# Patient Record
Sex: Female | Born: 1995 | Race: White | Hispanic: No | Marital: Married | Smoking: Never smoker
Health system: Southern US, Community
[De-identification: ages and names within clinical notes are randomized; demographics above are authoritative.]

## PROBLEM LIST (undated history)

## (undated) ENCOUNTER — Inpatient Hospital Stay: Payer: Self-pay

## (undated) DIAGNOSIS — R519 Headache, unspecified: Secondary | ICD-10-CM

## (undated) DIAGNOSIS — I10 Essential (primary) hypertension: Secondary | ICD-10-CM

## (undated) DIAGNOSIS — Z789 Other specified health status: Secondary | ICD-10-CM

---
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
## 2016-02-08 ENCOUNTER — Emergency Department: Payer: Medicaid Other

## 2016-02-08 ENCOUNTER — Emergency Department
Admission: EM | Admit: 2016-02-08 | Discharge: 2016-02-08 | Disposition: A | Discharge status: Home / Self Care | Payer: Medicaid Other | Attending: Emergency Medicine | Admitting: Emergency Medicine

## 2016-02-08 DIAGNOSIS — Z91013 Allergy to seafood: Secondary | ICD-10-CM | POA: Diagnosis not present

## 2016-02-08 DIAGNOSIS — F322 Major depressive disorder, single episode, severe without psychotic features: Secondary | ICD-10-CM | POA: Diagnosis not present

## 2016-02-08 DIAGNOSIS — R1031 Right lower quadrant pain: Secondary | ICD-10-CM | POA: Diagnosis present

## 2016-02-08 DIAGNOSIS — N83201 Unspecified ovarian cyst, right side: Secondary | ICD-10-CM | POA: Diagnosis not present

## 2016-02-08 DIAGNOSIS — Z91018 Allergy to other foods: Secondary | ICD-10-CM | POA: Insufficient documentation

## 2016-02-08 DIAGNOSIS — R102 Pelvic and perineal pain: Secondary | ICD-10-CM

## 2016-02-08 DIAGNOSIS — N83209 Unspecified ovarian cyst, unspecified side: Secondary | ICD-10-CM

## 2016-02-08 DIAGNOSIS — R1032 Left lower quadrant pain: Secondary | ICD-10-CM | POA: Insufficient documentation

## 2016-02-08 DIAGNOSIS — Z79899 Other long term (current) drug therapy: Secondary | ICD-10-CM | POA: Diagnosis not present

## 2016-02-08 LAB — URINALYSIS COMPLETE WITH MICROSCOPIC (ARMC ONLY)
BACTERIA UA: NONE SEEN
Bilirubin Urine: NEGATIVE
Glucose, UA: NEGATIVE mg/dL
Hgb urine dipstick: NEGATIVE
Ketones, ur: NEGATIVE mg/dL
Leukocytes, UA: NEGATIVE
Nitrite: NEGATIVE
PROTEIN: NEGATIVE mg/dL
Specific Gravity, Urine: 1.028 (ref 1.005–1.030)
pH: 5 (ref 5.0–8.0)

## 2016-02-08 LAB — COMPREHENSIVE METABOLIC PANEL
ALBUMIN: 4.2 g/dL (ref 3.5–5.0)
ALK PHOS: 61 U/L (ref 38–126)
ALT: 21 U/L (ref 14–54)
ANION GAP: 5 (ref 5–15)
AST: 23 U/L (ref 15–41)
BUN: 14 mg/dL (ref 6–20)
CO2: 22 mmol/L (ref 22–32)
Calcium: 9.2 mg/dL (ref 8.9–10.3)
Chloride: 108 mmol/L (ref 101–111)
Creatinine, Ser: 0.79 mg/dL (ref 0.44–1.00)
GFR calc Af Amer: >60 mL/min (ref >60)
GFR calc non Af Amer: >60 mL/min (ref >60)
GLUCOSE: 133 mg/dL — AB (ref 65–99)
POTASSIUM: 4.1 mmol/L (ref 3.5–5.1)
SODIUM: 135 mmol/L (ref 135–145)
Total Bilirubin: 0.3 mg/dL (ref 0.3–1.2)
Total Protein: 7.7 g/dL (ref 6.5–8.1)

## 2016-02-08 LAB — CBC
HEMATOCRIT: 43 % (ref 35.0–47.0)
HEMOGLOBIN: 14.5 g/dL (ref 12.0–16.0)
MCH: 28.8 pg (ref 26.0–34.0)
MCHC: 33.8 g/dL (ref 32.0–36.0)
MCV: 85.2 fL (ref 80.0–100.0)
Platelets: 278 10*3/uL (ref 150–440)
RBC: 5.05 MIL/uL (ref 3.80–5.20)
RDW: 14.1 % (ref 11.5–14.5)
WBC: 9 10*3/uL (ref 3.6–11.0)

## 2016-02-08 LAB — POCT PREGNANCY, URINE: PREG TEST UR: NEGATIVE

## 2016-02-08 LAB — LIPASE, BLOOD: Lipase: 20 U/L (ref 11–51)

## 2016-02-08 NOTE — Discharge Instructions (Signed)

## 2016-02-08 NOTE — ED Notes (Signed)
Pt c/o central abd pain that radiates out to the lower abd and back since around 845am .. Denies N/V/D.Shannon Madden. Last BM yesterday

## 2016-02-08 NOTE — ED Provider Notes (Signed)
[facility] Emergency Department Provider Note  ____________________________________________  Time seen: 2:30 PM  I have reviewed the triage vital signs and the nursing notes.   HISTORY  Chief Complaint Abdominal Pain    HPI Shannon Madden is a 20 y.o. female who complains of bilateral lower abdominal pain radiating around to the right back that started suddenly at about 9 AM. It is very severe at the time lasted for an hour or 2 and then seems to have abated. It is still present but very mild at this time. It's been constant. Not colicky. No previous similar pain. No history of ovarian cyst. Not pregnant. Normal regular menstrual periods. No dysuria frequency urgency or hematuria. She is sexually active. No vaginal discharge.  No trauma, no nausea vomiting diarrhea or fevers. Eating and drinking okay.     Past Medical History  Diagnosis Date  . Medical history non-contributory    depression   Patient Active Problem List   Diagnosis Date Noted  . MDD (major depressive disorder), single episode, severe (HCC) 12/24/2013     History reviewed. No pertinent past surgical history.   Current Outpatient Rx  Name  Route  Sig  Dispense  Refill  . PARoxetine (PAXIL) 20 MG tablet   Oral   Take 1 tablet (20 mg total) by mouth at bedtime.   30 tablet   0      Allergies Coconut oil and Fish allergy   No family history on file.  Social History Social History  Substance Use Topics  . Smoking status: Never Smoker   . Smokeless tobacco: Never Used  . Alcohol Use: No    Review of Systems  Constitutional:   No fever or chills. No weight changes Eyes:   No vision changes.  ENT:   No sore throat. No rhinorrhea. Cardiovascular:   No chest pain. Respiratory:   No dyspnea or cough. Gastrointestinal:   Positive for abdominal pain as above, no diarrhea or vomiting.Marland Kitchen.  No BRBPR or melena. Genitourinary:   Negative for dysuria or difficulty  urinating. Musculoskeletal:   Negative for focal pain or swelling Skin:   Negative for rash. Neurological:   Negative for headaches, focal weakness or numbness.  10-point ROS otherwise negative.  ____________________________________________   PHYSICAL EXAM:  VITAL SIGNS: ED Triage Vitals  Enc Vitals Group     BP 02/08/16 1009 130/69 mmHg     Pulse Rate 02/08/16 1009 96     Resp 02/08/16 1009 17     Temp 02/08/16 1009 98.9 F (37.2 C)     Temp Source 02/08/16 1009 Oral     SpO2 02/08/16 1009 99 %     Weight 02/08/16 1009 175 lb (79.379 kg)     Height 02/08/16 1009 5\' 6"  (1.676 m)     Head Cir --      Peak Flow --      Pain Score 02/08/16 1010 5     Pain Loc --      Pain Edu? --      Excl. in GC? --     Vital signs reviewed, nursing assessments reviewed.   Constitutional:   Alert and oriented. Well appearing and in no distress. Eyes:   No scleral icterus. No conjunctival pallor. PERRL. EOMI ENT   Head:   Normocephalic and atraumatic.   Nose:   No congestion/rhinnorhea. No septal hematoma   Mouth/Throat:   MMM, no pharyngeal erythema. No peritonsillar mass.    Neck:  No stridor. No SubQ emphysema. No meningismus. Hematological/Lymphatic/Immunilogical:   No cervical lymphadenopathy. Cardiovascular:   RRR. Symmetric bilateral radial and DP pulses.  No murmurs.  Respiratory:   Normal respiratory effort without tachypnea nor retractions. Breath sounds are clear and equal bilaterally. No wheezes/rales/rhonchi. Gastrointestinal:   Soft with diffuse mild tenderness of the bilateral lower abdomen and suprapubic area. No focal tenderness in the right lower quadrant or at McBurney's point.. Non distended. There is no CVA tenderness.  No rebound, rigidity, or guarding. Genitourinary:   deferred Musculoskeletal:   Nontender with normal range of motion in all extremities. No joint effusions.  No lower extremity tenderness.  No edema. Neurologic:   Normal speech and  language.  CN 2-10 normal. Motor grossly intact. No gross focal neurologic deficits are appreciated.  Skin:    Skin is warm, dry and intact. No rash noted.  No petechiae, purpura, or bullae. ____________________________________________    LABS (pertinent positives/negatives) (all labs ordered are listed, but only abnormal results are displayed) Labs Reviewed  COMPREHENSIVE METABOLIC PANEL - Abnormal; Notable for the following:    Glucose, Bld 133 (*)    All other components within normal limits  URINALYSIS COMPLETEWITH MICROSCOPIC ([facility] ONLY) - Abnormal; Notable for the following:    Color, Urine YELLOW (*)    APPearance CLEAR (*)    Squamous Epithelial / LPF 0-5 (*)    All other components within normal limits  LIPASE, BLOOD  CBC  POC URINE PREG, ED  POCT PREGNANCY, URINE   ____________________________________________   EKG    ____________________________________________    RADIOLOGY  Ultrasound pelvis shows a small amount of physiologic free fluid, likely recently ruptured ovarian cyst. No evidence of current cyst or torsion.  ____________________________________________   PROCEDURES   ____________________________________________   INITIAL IMPRESSION / ASSESSMENT AND PLAN / ED COURSE  Pertinent labs & imaging results that were available during my care of the patient were reviewed by me and considered in my medical decision making (see chart for details).  Visual well-appearing no acute distress. Had an episode of severe pelvic pain, suspected ovarian cyst. She does have some free fluid which again suggests that maybe there was a recently ruptured cyst. There is no evidence of STI or PID TOA torsion currently. Low suspicion for appendicitis. We will discharge home, follow-up with pediatrician. Patient counseled extensively with return precautions     ____________________________________________   FINAL CLINICAL IMPRESSION(S) / ED DIAGNOSES  Final  diagnoses:  Ruptured ovarian cyst   pelvic pain in female.    Sharman Cheek, MD 02/08/16 1630

## 2016-10-31 LAB — OB RESULTS CONSOLE RPR: RPR: NONREACTIVE

## 2016-10-31 LAB — OB RESULTS CONSOLE VARICELLA ZOSTER ANTIBODY, IGG: Varicella: IMMUNE

## 2016-10-31 LAB — OB RESULTS CONSOLE HIV ANTIBODY (ROUTINE TESTING): HIV: NONREACTIVE

## 2016-10-31 LAB — OB RESULTS CONSOLE RUBELLA ANTIBODY, IGM: Rubella: IMMUNE

## 2016-10-31 LAB — OB RESULTS CONSOLE HEPATITIS B SURFACE ANTIGEN: Hepatitis B Surface Ag: NEGATIVE

## 2016-11-18 NOTE — L&D Delivery Note (Signed)
Date of delivery: 05/06/17 Estimated Date of Delivery: 06/02/17 Patient's last menstrual period was 08/03/2016. EGA: 6149w1d  Delivery Note At 1:30 PM a viable female was delivered via Vaginal, Spontaneous Delivery (Presentation: cephalic, LOA).  APGAR: 7, 8; weight 6 lb 13.4 oz (3100 g).   Placenta status: spontaneous, intact.  Cord: 3v  with the following complications: nuchalx, 1loose.  Cord pH: not collected  Anesthesia:  none Episiotomy:  none Lacerations: 1st degree Suture Repair: 3.0 vicryl Est. Blood Loss (mL):  350cc  Mom presented to L&D with PPROM at 36.1 weeks and in labor. Was given IV stadol and fentanyl.  Progressed to complete, second stage: 20 mins.  delivery of fetal head with restitution to LOT. Nuchal cord not able to be reduced at perineum.  Anterior then posterior shoulders delivered without difficulty.   Due to flaccid tone, cord was immediately cut and handed off to pediatrician. Placenta spontaneously delivered, intact.   IM then IV pitocin given for hemorrhage prophylaxis, as IV access was lost during delivery.  The baby was attended to and was then placed on mom's chest.  We sang Happy [place] Ouch[place]irthday to baby Shannon Madden.   Mom to postpartum.  Baby to Couplet care / Skin to Skin.  Shannon Madden Shannon Madden Shannon Madden 05/06/2017, 6:46 PM

## 2016-12-01 ENCOUNTER — Emergency Department: Payer: Medicaid Other

## 2016-12-01 ENCOUNTER — Emergency Department
Admission: EM | Admit: 2016-12-01 | Discharge: 2016-12-01 | Disposition: A | Discharge status: Home / Self Care | Payer: Medicaid Other | Attending: Emergency Medicine | Admitting: Emergency Medicine

## 2016-12-01 DIAGNOSIS — Z3A13 13 weeks gestation of pregnancy: Secondary | ICD-10-CM | POA: Insufficient documentation

## 2016-12-01 DIAGNOSIS — Z3492 Encounter for supervision of normal pregnancy, unspecified, second trimester: Secondary | ICD-10-CM

## 2016-12-01 DIAGNOSIS — O4691 Antepartum hemorrhage, unspecified, first trimester: Secondary | ICD-10-CM | POA: Insufficient documentation

## 2016-12-01 DIAGNOSIS — R102 Pelvic and perineal pain: Secondary | ICD-10-CM | POA: Insufficient documentation

## 2016-12-01 DIAGNOSIS — O26891 Other specified pregnancy related conditions, first trimester: Secondary | ICD-10-CM | POA: Diagnosis present

## 2016-12-01 DIAGNOSIS — R8299 Other abnormal findings in urine: Secondary | ICD-10-CM | POA: Diagnosis not present

## 2016-12-01 DIAGNOSIS — N939 Abnormal uterine and vaginal bleeding, unspecified: Secondary | ICD-10-CM

## 2016-12-01 LAB — URINALYSIS, COMPLETE (UACMP) WITH MICROSCOPIC
BACTERIA UA: NONE SEEN
Bilirubin Urine: NEGATIVE
Glucose, UA: NEGATIVE mg/dL
Ketones, ur: 20 mg/dL — AB
LEUKOCYTES UA: NEGATIVE
Nitrite: NEGATIVE
PROTEIN: NEGATIVE mg/dL
SPECIFIC GRAVITY, URINE: 1.021 (ref 1.005–1.030)
pH: 5 (ref 5.0–8.0)

## 2016-12-01 LAB — ABO/RH: ABO/RH(D): O POS

## 2016-12-01 LAB — BASIC METABOLIC PANEL
ANION GAP: 8 (ref 5–15)
BUN: 8 mg/dL (ref 6–20)
CALCIUM: 9.2 mg/dL (ref 8.9–10.3)
CO2: 23 mmol/L (ref 22–32)
CREATININE: 0.55 mg/dL (ref 0.44–1.00)
Chloride: 103 mmol/L (ref 101–111)
GLUCOSE: 81 mg/dL (ref 65–99)
Potassium: 3.9 mmol/L (ref 3.5–5.1)
Sodium: 134 mmol/L — ABNORMAL LOW (ref 135–145)

## 2016-12-01 LAB — CBC WITH DIFFERENTIAL/PLATELET
BASOS ABS: 0 10*3/uL (ref 0–0.1)
Basophils Relative: 0 %
EOS ABS: 0.1 10*3/uL (ref 0–0.7)
Eosinophils Relative: 1 %
HCT: 35.7 % (ref 35.0–47.0)
Hemoglobin: 12.2 g/dL (ref 12.0–16.0)
Lymphocytes Relative: 16 %
Lymphs Abs: 1.8 10*3/uL (ref 1.0–3.6)
MCH: 29.3 pg (ref 26.0–34.0)
MCHC: 34.2 g/dL (ref 32.0–36.0)
MCV: 85.7 fL (ref 80.0–100.0)
MONO ABS: 0.7 10*3/uL (ref 0.2–0.9)
Monocytes Relative: 6 %
Neutro Abs: 8.7 10*3/uL — ABNORMAL HIGH (ref 1.4–6.5)
Neutrophils Relative %: 77 %
PLATELETS: 248 10*3/uL (ref 150–440)
RBC: 4.17 MIL/uL (ref 3.80–5.20)
RDW: 13.6 % (ref 11.5–14.5)
WBC: 11.2 10*3/uL — AB (ref 3.6–11.0)

## 2016-12-01 LAB — HCG, QUANTITATIVE, PREGNANCY: HCG, BETA CHAIN, QUANT, S: 104067 m[IU]/mL — AB (ref <5)

## 2016-12-01 NOTE — ED Notes (Signed)
Patient upset with waiting and continues to bleed. Informed her that there was a wait. I spoke with Dr. Scotty CourtStafford about patient and was given additional orders for labs and ultrasound.

## 2016-12-01 NOTE — Discharge Instructions (Signed)
Your blood tests and ultrasound today were unremarkable.  The pregnancy appears to be uncomplicated at this time.

## 2016-12-01 NOTE — ED Triage Notes (Signed)
Pt reports that after sexual intercourse this AM she is having bright red bleeding and mild pelvic cramping. Pt alert and oriented X4, active, cooperative, pt in NAD. RR even and unlabored, color WNL.

## 2016-12-01 NOTE — ED Notes (Signed)
FIRST NURSE NOTE: Pt states she is about 13-[redacted] weeks pregnant, vaginal bleeding started about 1 hour ago.  Some clots passed.

## 2016-12-01 NOTE — ED Provider Notes (Signed)
[facility] Emergency Department Provider Note  ____________________________________________  Time seen: Approximately 1:32 PM  I have reviewed the triage vital signs and the nursing notes.   HISTORY  Chief Complaint Vaginal Bleeding    HPI Shannon Madden is a 21 y.o. female who complains of vaginal bleeding today. The bleeding started after she had sex with her partner this morning. He was initially heavy and has gradually decreased over time. She is concerned because she is [redacted] weeks pregnant. [facility]. She had an ultrasound 11/08/2016 which confirmed an IUP. Pregnancy has been uncomplicated. This is her first pregnancy. Denies cramping or contractions. No fever chills vomiting chest pain or shortness of breath. Otherwise asymptomatic. No dysuria frequency urgency. No hematuria.     Past Medical History:  Diagnosis Date  . Medical history non-contributory   Second trimester pregnancy   Patient Active Problem List   Diagnosis Date Noted  . MDD (major depressive disorder), single episode, severe (HCC) 12/24/2013     History reviewed. No pertinent surgical history.   Prior to Admission medications   Medication Sig Start Date End Date Taking? Authorizing Provider  Butalbital-APAP-Caffeine 50-325-40 MG capsule Take 1 capsule by mouth every 4 (four) hours as needed. 11/27/16 12/07/16 Yes Historical Provider, MD     Allergies Coconut oil and Fish allergy   No family history on file.  Social History Social History  Substance Use Topics  . Smoking status: Never Smoker  . Smokeless tobacco: Never Used  . Alcohol use No    Review of Systems  Constitutional:   No fever or chills.  ENT:   No sore throat. No rhinorrhea. Cardiovascular:   No chest pain. Respiratory:   No dyspnea or cough. Gastrointestinal:   Negative for abdominal pain, vomiting and diarrhea.  Genitourinary:   Negative for dysuria or  difficulty urinating.Positive vaginal bleeding Musculoskeletal:   Negative for focal pain or swelling Neurological:   Negative for headaches 10-point ROS otherwise negative.  ____________________________________________   PHYSICAL EXAM:  VITAL SIGNS: ED Triage Vitals  Enc Vitals Group     BP 12/01/16 0940 134/73     Pulse Rate 12/01/16 0940 86     Resp 12/01/16 0940 18     Temp 12/01/16 0940 98.4 F (36.9 C)     Temp Source 12/01/16 0940 Oral     SpO2 12/01/16 0940 100 %     Weight 12/01/16 0941 185 lb (83.9 kg)     Height 12/01/16 0941 5\' 6"  (1.676 m)     Head Circumference --      Peak Flow --      Pain Score 12/01/16 0941 4     Pain Loc --      Pain Edu? --      Excl. in GC? --     Vital signs reviewed, nursing assessments reviewed.   Constitutional:   Alert and oriented. Well appearing and in no distress. Eyes:   No scleral icterus. No conjunctival pallor. PERRL. EOMI.  No nystagmus. ENT   Head:   Normocephalic and atraumatic.   Nose:   No congestion/rhinnorhea. No septal hematoma   Mouth/Throat:   MMM, no pharyngeal erythema. No peritonsillar mass.    Neck:   No stridor. No SubQ emphysema. No meningismus. Hematological/Lymphatic/Immunilogical:   No cervical lymphadenopathy. Cardiovascular:   RRR. Symmetric bilateral radial and DP pulses.  No murmurs.  Respiratory:   Normal respiratory effort without tachypnea nor retractions. Breath sounds are  clear and equal bilaterally. No wheezes/rales/rhonchi. Gastrointestinal:   Soft With mild suprapubic tenderness. Non distended. There is no CVA tenderness.  No rebound, rigidity, or guarding. Genitourinary:   deferred Musculoskeletal:   Nontender with normal range of motion in all extremities. No joint effusions.  No lower extremity tenderness.  No edema. Neurologic:   Normal speech and language.  CN 2-10 normal. Motor grossly intact. No gross focal neurologic deficits are appreciated.  Skin:    Skin is warm,  dry and intact. No rash noted.  No petechiae, purpura, or bullae.  ____________________________________________    LABS (pertinent positives/negatives) (all labs ordered are listed, but only abnormal results are displayed) Labs Reviewed  HCG, QUANTITATIVE, PREGNANCY - Abnormal; Notable for the following:       Result Value   hCG, Beta Chain, Quant, S 104,067 (*)    All other components within normal limits  CBC WITH DIFFERENTIAL/PLATELET - Abnormal; Notable for the following:    WBC 11.2 (*)    Neutro Abs 8.7 (*)    All other components within normal limits  BASIC METABOLIC PANEL - Abnormal; Notable for the following:    Sodium 134 (*)    All other components within normal limits  URINALYSIS, COMPLETE (UACMP) WITH MICROSCOPIC - Abnormal; Notable for the following:    Color, Urine YELLOW (*)    APPearance CLEAR (*)    Hgb urine dipstick LARGE (*)    Ketones, ur 20 (*)    Squamous Epithelial / LPF 0-5 (*)    All other components within normal limits  URINE CULTURE  ABO/RH   ____________________________________________   EKG    ____________________________________________    RADIOLOGY  Ultrasound pelvis unremarkable. Live IUP. Fetal heart rate 158  ____________________________________________   PROCEDURES Procedures  ____________________________________________   INITIAL IMPRESSION / ASSESSMENT AND PLAN / ED COURSE  Pertinent labs & imaging results that were available during my care of the patient were reviewed by me and considered in my medical decision making (see chart for details).  Patient presents with vaginal bleeding after sexual intercourse. Workup unremarkable, no couple patient pregnancy. Rh+. Discharge home follow up with OB. This appears to be due to minor trauma and is self-limited. By patient's account it is improving.     Clinical Course    ____________________________________________   FINAL CLINICAL IMPRESSION(S) / ED  DIAGNOSES  Final diagnoses:  Abnormal vaginal bleeding  Second trimester pregnancy      New Prescriptions   No medications on file     Portions of this note were generated with dragon dictation software. Dictation errors may occur despite best attempts at proofreading.    Sharman Cheek, MD 12/01/16 1336

## 2016-12-02 LAB — URINE CULTURE: Culture: <10000 — AB

## 2017-01-20 ENCOUNTER — Other Ambulatory Visit: Payer: Self-pay | Admitting: Obstetrics and Gynecology

## 2017-01-20 DIAGNOSIS — Z3689 Encounter for other specified antenatal screening: Secondary | ICD-10-CM

## 2017-01-30 ENCOUNTER — Other Ambulatory Visit: Payer: Self-pay | Admitting: Obstetrics & Gynecology

## 2017-01-30 ENCOUNTER — Ambulatory Visit
Admission: RE | Admit: 2017-01-30 | Discharge: 2017-01-30 | Disposition: A | Discharge status: Home / Self Care | Payer: Medicaid Other | Source: Ambulatory Visit | Attending: Obstetrics & Gynecology | Admitting: Obstetrics & Gynecology

## 2017-01-30 ENCOUNTER — Ambulatory Visit: Payer: Medicaid Other

## 2017-01-30 DIAGNOSIS — N2889 Other specified disorders of kidney and ureter: Secondary | ICD-10-CM | POA: Insufficient documentation

## 2017-01-30 DIAGNOSIS — Z3689 Encounter for other specified antenatal screening: Secondary | ICD-10-CM | POA: Diagnosis not present

## 2017-01-30 DIAGNOSIS — Z3A22 22 weeks gestation of pregnancy: Secondary | ICD-10-CM | POA: Diagnosis not present

## 2017-01-30 DIAGNOSIS — O26832 Pregnancy related renal disease, second trimester: Secondary | ICD-10-CM | POA: Insufficient documentation

## 2017-01-30 DIAGNOSIS — O283 Abnormal ultrasonic finding on antenatal screening of mother: Secondary | ICD-10-CM

## 2017-01-30 NOTE — Progress Notes (Signed)
[facility] [place]. See US report.  Jalynn Waddell, [place], MD

## 2017-02-07 LAB — INFORMASEQ(SM) WITH XY ANALYSIS
FETAL FRACTION (%): 13.4
GESTATIONAL AGE AT COLLECTION: 22.2 wk

## 2017-04-21 ENCOUNTER — Other Ambulatory Visit: Payer: Self-pay | Admitting: *Deleted

## 2017-04-21 DIAGNOSIS — N1330 Unspecified hydronephrosis: Secondary | ICD-10-CM

## 2017-04-24 ENCOUNTER — Ambulatory Visit
Admission: RE | Admit: 2017-04-24 | Discharge: 2017-04-24 | Disposition: A | Discharge status: Home / Self Care | Payer: Medicaid Other | Source: Ambulatory Visit | Attending: Maternal and Fetal Medicine | Admitting: Maternal and Fetal Medicine

## 2017-04-24 DIAGNOSIS — O368930 Maternal care for other specified fetal problems, third trimester, not applicable or unspecified: Secondary | ICD-10-CM | POA: Insufficient documentation

## 2017-04-24 DIAGNOSIS — Z3A34 34 weeks gestation of pregnancy: Secondary | ICD-10-CM | POA: Insufficient documentation

## 2017-04-24 DIAGNOSIS — N1330 Unspecified hydronephrosis: Secondary | ICD-10-CM

## 2017-04-24 DIAGNOSIS — Q620 Congenital hydronephrosis: Secondary | ICD-10-CM | POA: Insufficient documentation

## 2017-04-24 DIAGNOSIS — Z362 Encounter for other antenatal screening follow-up: Secondary | ICD-10-CM | POA: Diagnosis not present

## 2017-04-24 DIAGNOSIS — O283 Abnormal ultrasonic finding on antenatal screening of mother: Secondary | ICD-10-CM

## 2017-04-24 DIAGNOSIS — Z3689 Encounter for other specified antenatal screening: Secondary | ICD-10-CM | POA: Insufficient documentation

## 2017-04-24 DIAGNOSIS — N2889 Other specified disorders of kidney and ureter: Secondary | ICD-10-CM

## 2017-05-05 ENCOUNTER — Encounter: Payer: Self-pay | Admitting: *Deleted

## 2017-05-05 ENCOUNTER — Observation Stay
Admission: EM | Admit: 2017-05-05 | Discharge: 2017-05-05 | Disposition: A | Discharge status: Home / Self Care | Payer: Medicaid Other | Source: Home / Self Care | Admitting: Obstetrics & Gynecology

## 2017-05-05 DIAGNOSIS — Z3A36 36 weeks gestation of pregnancy: Secondary | ICD-10-CM

## 2017-05-05 DIAGNOSIS — O99283 Endocrine, nutritional and metabolic diseases complicating pregnancy, third trimester: Secondary | ICD-10-CM

## 2017-05-05 DIAGNOSIS — O283 Abnormal ultrasonic finding on antenatal screening of mother: Secondary | ICD-10-CM

## 2017-05-05 DIAGNOSIS — O26893 Other specified pregnancy related conditions, third trimester: Secondary | ICD-10-CM | POA: Insufficient documentation

## 2017-05-05 DIAGNOSIS — O479 False labor, unspecified: Secondary | ICD-10-CM

## 2017-05-05 DIAGNOSIS — N2889 Other specified disorders of kidney and ureter: Secondary | ICD-10-CM

## 2017-05-05 DIAGNOSIS — E860 Dehydration: Secondary | ICD-10-CM

## 2017-05-05 MED ORDER — OXYCODONE-ACETAMINOPHEN 5-325 MG PO TABS: 1.0000 | ORAL_TABLET | ORAL | Status: DC | PRN

## 2017-05-05 MED ORDER — ACETAMINOPHEN 325 MG PO TABS: 650.0000 mg | ORAL_TABLET | ORAL | Status: DC | PRN

## 2017-05-05 MED ORDER — LACTATED RINGERS IV SOLN: 500.0000 mL | INTRAVENOUS | Status: DC | PRN

## 2017-05-05 NOTE — Progress Notes (Signed)
     Progress Notes - in this encounter  HPI: She is a 21 y.o., female, G1P0, at 4017w6d based on LMP: 08/03/16 , with an Estimated Date of Delivery: 05/09/17.  CC: C/O :cramping in the front and not sure if this is labor contractions.    OB History  Gravida Para Term Preterm AB Living  1  SAB TAB Ectopic Molar Multiple Live Births    # Outcome Date GA Lbr Len/2nd Weight Sex Delivery Anes PTL Lv  1 Current    The following portions of the patient's history were reviewed and updated as appropriate: Past Medical History: has no past medical history on file. Past Surgical History: has no past surgical history on file. Family History: family history includes Leukemia in her mother. Social History: reports that she has never smoked. She has never used smokeless tobacco. She reports that she does not drink alcohol or use drugs. Scheduled Meds: Continuous Infusions: . lactated ringers     PRN Meds:.acetaminophen, lactated ringers, oxyCODONE-acetaminophen Allergies: is allergic to coconut oil and other.  Patient Active Problem List  Diagnosis  . Supervision of normal first teen pregnancy in first trimester     ROS: General: No weight loss or weight gain, + fatigue or no fevers. HEENT: No change in vision, double vision or loss of hearing. No nosebleeds, difficulty swallowing, sore throat or any other complaints.  BREAST:No  breast tenderness, no nipple discharge, no masses, no skin changes, no nodes HEART: No CP, palpitations, +swelling in the feet or legs. No  difficulty breathing while lying flat. LUNGS: No SOB, Cough, Congestion or wheezing. GASTROINTESTINAL: No nausea, vomiting, diarrhea, constipation, no heartburn or stomach pain GENITOURINARY: No urgency,  frequency or No dysuria. MUSCULOSKELETAL: No joint pain, weakness, cramping or back pain. NEUROLOGIC: occasional HA's, no numbness, blackouts or dizziness. PSYCHIATRIC: No anxiety, panic or depression or increased stress  or suicidal ideation. ENDOCRINE: No heat or cold intolerance, excessive thirst or hunger. HEMATOLOGIC/LYMPH: No easy bruising or swollen lymph nodes. GYN: No vaginal bleeding, occasional cramping, abnormal vaginal discharge, or vaginal odor.  EXAM:  Gen42: 20 yo white female in NAD. HEENT: Normocephalic, Eyes non-icteric. Resp reg and non-labored Heart: S1S2, RRR, No M/R/G. Abd: Gravid, EFW 6#9oz Extrems: 1+edema of lower extrem, Reflex 1+/0  Cx: closed A:1. IUP at 36 weeks with Th PTL 2. Dehydration vs Early Labor pattern P: PO HYdration 2. If UC's do not stop, consider Terb 0.25 mg SQ. 3. NST reactive with 2 accels 15 x 15 BPM  Nilsa Nuttingaron W. JOne,s RN, MSN, CNM, FNP [facility]

## 2017-05-05 NOTE — Discharge Summary (Signed)
Obstetric Discharge Summary   Patient ID: Shannon Madden MRN: [id number] DOB/AGE: 20/07/1996 20 y.o.   Date of Admission: 05/05/2017  Date of Discharge:  05/05/17 Admitting Diagnosis: Observation and Scheduled cesarean section at 7189w0d  Secondary Diagnosis: dehydration, braxton-hicks  Mode of Delivery: N/A     Discharge Diagnosis: IUP at 36 weeks with B-H's and dehydration issues   Intrapartum Procedures: N/A   Post partum procedures:N/A  Complications: Dehydration    Brief Hospital Course  Shannon Madden is a G1P0 who is having abd cramping today and was unsure if she was in labor.    Patient had an uncomplicated triage visit and was deemed stable for discharge to home.     Labs: CBC Latest Ref Rng & Units 12/01/2016 02/08/2016 12/23/2013  WBC 3.6 - 11.0 K/uL 11.2(H) 9.0 10.2  Hemoglobin 12.0 - 16.0 g/dL 78.412.2 69.614.5 29.512.6  Hematocrit 35.0 - 47.0 % 35.7 43.0 38.5  Platelets 150 - 440 K/uL 248 278 285   O POS  Physical exam:  Last menstrual period 08/03/2016. General: alert and no distress Abdomen: Gravid,  Extremities: No evidence of DVT seen on physical exam. No lower extremity edema.  Discharge Instructions: Hydrate and rest.  Activity: Up abd lib.  Also refer to After Visit Summary Diet: Regular Medications:  Outpatient follow up: in office this week, [facility]  Discharged Condition: Stable   Discharged to: home    Sharee PimpleCaron W Adreona Brand, [place] 05/05/2017

## 2017-05-05 NOTE — OB Triage Note (Signed)
Recvd to OBS 2 with C/O contractions since this AM growing stronger in intensity.  Changed to gown and to bed.  Plan of care discussed.  Oriented to room.  Agrees with plan.  Verbalized understanding.

## 2017-05-05 NOTE — Progress Notes (Addendum)
NST reactive with 2 accels 15 x 15 BPM. UC's seem to be winding down. PO Hydration vs giving Terb. Will dc soon. Pt feels improved and decreased cramps since here and having po hydration, Will dc home

## 2017-05-05 NOTE — Discharge Instructions (Signed)
Preterm Labor and Birth Information °Pregnancy normally lasts 39-41 weeks. Preterm labor is when labor starts early. It starts before you have been pregnant for 37 whole weeks. °What are the risk factors for preterm labor? °Preterm labor is more likely to occur in women who: °· Have an infection while pregnant. °· Have a cervix that is short. °· Have gone into preterm labor before. °· Have had surgery on their cervix. °· Are younger than age 20. °· Are older than age 35. °· Are African American. °· Are pregnant with two or more babies. °· Take street drugs while pregnant. °· Smoke while pregnant. °· Do not gain enough weight while pregnant. °· Got pregnant right after another pregnancy. ° °What are the symptoms of preterm labor? °Symptoms of preterm labor include: °· Cramps. The cramps may feel like the cramps some women get during their period. The cramps may happen with watery poop (diarrhea). °· Pain in the belly (abdomen). °· Pain in the lower back. °· Regular contractions or tightening. It may feel like your belly is getting tighter. °· Pressure in the lower belly that seems to get stronger. °· More fluid (discharge) leaking from the vagina. The fluid may be watery or bloody. °· Water breaking. ° °Why is it important to notice signs of preterm labor? °Babies who are born early may not be fully developed. They have a higher chance for: °· Long-term heart problems. °· Long-term lung problems. °· Trouble controlling body systems, like breathing. °· Bleeding in the brain. °· A condition called cerebral palsy. °· Learning difficulties. °· Death. ° °These risks are highest for babies who are born before 34 weeks of pregnancy. °How is preterm labor treated? °Treatment depends on: °· How long you were pregnant. °· Your condition. °· The health of your baby. ° °Treatment may involve: °· Having a stitch (suture) placed in your cervix. When you give birth, your cervix opens so the baby can come out. The stitch keeps the  cervix from opening too soon. °· Staying at the hospital. °· Taking or getting medicines, such as: °? Hormone medicines. °? Medicines to stop contractions. °? Medicines to help the baby’s lungs develop. °? Medicines to prevent your baby from having cerebral palsy. ° °What should I do if I am in preterm labor? °If you think you are going into labor too soon, call your doctor right away. °How can I prevent preterm labor? °· Do not use any tobacco products. °? Examples of these are cigarettes, chewing tobacco, and e-cigarettes. °? If you need help quitting, ask your doctor. °· Do not use street drugs. °· Do not use any medicines unless you ask your doctor if they are safe for you. °· Talk with your doctor before taking any herbal supplements. °· Make sure you gain enough weight. °· Watch for infection. If you think you might have an infection, get it checked right away. °· If you have gone into preterm labor before, tell your doctor. °This information is not intended to replace advice given to you by your health care provider. Make sure you discuss any questions you have with your health care provider. °Document Released: 01/31/2009 Document Revised: 04/16/2016 Document Reviewed: 03/27/2016 °Elsevier Interactive Patient Education © 2018 [employer] ° ° °Dehydration, Adult °Dehydration is when there is not enough fluid or water in your body. This happens when you lose more fluids than you take in. Dehydration can range from mild to very bad. It should be treated right away to keep   it from getting very bad. °Symptoms of mild dehydration may include: °· Thirst. °· Dry lips. °· Slightly dry mouth. °· Dry, warm skin. °· Dizziness. °Symptoms of moderate dehydration may include: °· Very dry mouth. °· Muscle cramps. °· Dark pee (urine). Pee may be the color of tea. °· Your body making less pee. °· Your eyes making fewer tears. °· Heartbeat that is uneven or faster than normal (palpitations). °· Headache. °· Light-headedness,  especially when you stand up from sitting. °· Fainting (syncope). °Symptoms of very bad dehydration may include: °· Changes in skin, such as: °? Cold and clammy skin. °? Blotchy (mottled) or pale skin. °? Skin that does not quickly return to normal after being lightly pinched and let go (poor skin turgor). °· Changes in body fluids, such as: °? Feeling very thirsty. °? Your eyes making fewer tears. °? Not sweating when body temperature is high, such as in hot weather. °? Your body making very little pee. °· Changes in vital signs, such as: °? Weak pulse. °? Pulse that is more than 100 beats a minute when you are sitting still. °? Fast breathing. °? Low blood pressure. °· Other changes, such as: °? Sunken eyes. °? Cold hands and feet. °? Confusion. °? Lack of energy (lethargy). °? Trouble waking up from sleep. °? Short-term weight loss. °? Unconsciousness. °Follow these instructions at home: °· If told by your doctor, drink an ORS: °? Make an ORS by using instructions on the package. °? Start by drinking small amounts, about ½ cup (120 mL) every 5-10 minutes. °? Slowly drink more until you have had the amount that your doctor said to have. °· Drink enough clear fluid to keep your pee clear or pale yellow. If you were told to drink an ORS, finish the ORS first, then start slowly drinking clear fluids. Drink fluids such as: °? Water. Do not drink only water by itself. Doing that can make the salt (sodium) level in your body get too low (hyponatremia). °? Ice chips. °? Fruit juice that you have added water to (diluted). °? Low-calorie sports drinks. °· Avoid: °? Alcohol. °? Drinks that have a lot of sugar. These include high-calorie sports drinks, fruit juice that does not have water added, and soda. °? Caffeine. °? Foods that are greasy or have a lot of fat or sugar. °· Take over-the-counter and prescription medicines only as told by your doctor. °· Do not take salt tablets. Doing that can make the salt level in your  body get too high (hypernatremia). °· Eat foods that have minerals (electrolytes). Examples include bananas, oranges, potatoes, tomatoes, and spinach. °· Keep all follow-up visits as told by your doctor. This is important. °Contact a doctor if: °· You have belly (abdominal) pain that: °? Gets worse. °? Stays in one area (localizes). °· You have a rash. °· You have a stiff neck. °· You get angry or annoyed more easily than normal (irritability). °· You are more sleepy than normal. °· You have a harder time waking up than normal. °· You feel: °? Weak. °? Dizzy. °? Very thirsty. °· You have peed (urinated) only a small amount of very dark pee during 6-8 hours. °Get help right away if: °· You have symptoms of very bad dehydration. °· You cannot drink fluids without throwing up (vomiting). °· Your symptoms get worse with treatment. °· You have a fever. °· You have a very bad headache. °· You are throwing up or having watery poop (diarrhea)   and it: °? Gets worse. °? Does not go away. °· You have blood or something green (bile) in your throw-up. °· You have blood in your poop (stool). This may cause poop to look black and tarry. °· You have not peed in 6-8 hours. °· You pass out (faint). °· Your heart rate when you are sitting still is more than 100 beats a minute. °· You have trouble breathing. °This information is not intended to replace advice given to you by your health care provider. Make sure you discuss any questions you have with your health care provider. °Document Released: 08/31/2009 Document Revised: 05/24/2016 Document Reviewed: 12/29/2015 °Elsevier Interactive Patient Education © 2018 Elsevier Inc. ° ° °

## 2017-05-06 ENCOUNTER — Inpatient Hospital Stay
Admission: EM | Admit: 2017-05-06 | Discharge: 2017-05-08 | DRG: 775 | Disposition: A | Discharge status: Home / Self Care | Payer: Medicaid Other | Attending: Obstetrics & Gynecology | Admitting: Obstetrics & Gynecology

## 2017-05-06 DIAGNOSIS — O99214 Obesity complicating childbirth: Secondary | ICD-10-CM | POA: Diagnosis present

## 2017-05-06 DIAGNOSIS — F329 Major depressive disorder, single episode, unspecified: Secondary | ICD-10-CM | POA: Diagnosis present

## 2017-05-06 DIAGNOSIS — Z3A36 36 weeks gestation of pregnancy: Secondary | ICD-10-CM

## 2017-05-06 DIAGNOSIS — E669 Obesity, unspecified: Secondary | ICD-10-CM | POA: Diagnosis present

## 2017-05-06 DIAGNOSIS — O99344 Other mental disorders complicating childbirth: Secondary | ICD-10-CM | POA: Diagnosis present

## 2017-05-06 DIAGNOSIS — O42913 Preterm premature rupture of membranes, unspecified as to length of time between rupture and onset of labor, third trimester: Secondary | ICD-10-CM | POA: Diagnosis present

## 2017-05-06 DIAGNOSIS — O134 Gestational [pregnancy-induced] hypertension without significant proteinuria, complicating childbirth: Secondary | ICD-10-CM | POA: Diagnosis present

## 2017-05-06 DIAGNOSIS — O358XX0 Maternal care for other (suspected) fetal abnormality and damage, not applicable or unspecified: Secondary | ICD-10-CM | POA: Diagnosis present

## 2017-05-06 DIAGNOSIS — Z68.41 Body mass index (BMI) pediatric, 85th percentile to less than 95th percentile for age: Secondary | ICD-10-CM

## 2017-05-06 LAB — CBC
HCT: 34.5 % — ABNORMAL LOW (ref 35.0–47.0)
HEMOGLOBIN: 12 g/dL (ref 12.0–16.0)
MCH: 29.9 pg (ref 26.0–34.0)
MCHC: 34.8 g/dL (ref 32.0–36.0)
MCV: 85.7 fL (ref 80.0–100.0)
Platelets: 223 10*3/uL (ref 150–440)
RBC: 4.02 MIL/uL (ref 3.80–5.20)
RDW: 13.6 % (ref 11.5–14.5)
WBC: 10.5 10*3/uL (ref 3.6–11.0)

## 2017-05-06 LAB — PROTEIN / CREATININE RATIO, URINE
CREATININE, URINE: 149 mg/dL
Protein Creatinine Ratio: 0.17 mg/mg{Cre} — ABNORMAL HIGH (ref 0.00–0.15)
TOTAL PROTEIN, URINE: 25 mg/dL

## 2017-05-06 LAB — TYPE AND SCREEN
ABO/RH(D): O POS
ANTIBODY SCREEN: NEGATIVE

## 2017-05-06 MED ORDER — SODIUM CHLORIDE 0.9 % IV SOLN
1.0000 g | INTRAVENOUS | Status: DC
  Administered 2017-05-06: 1 g via INTRAVENOUS
  Filled 2017-05-06 (×9): qty 1000

## 2017-05-06 MED ORDER — IBUPROFEN 600 MG PO TABS
600.0000 mg | ORAL_TABLET | Freq: Four times a day (QID) | ORAL | Status: DC
  Administered 2017-05-06 – 2017-05-07 (×4): 600 mg via ORAL
  Administered 2017-05-07: 13:00:00 via ORAL
  Administered 2017-05-07 – 2017-05-08 (×3): 600 mg via ORAL
  Filled 2017-05-06 (×7): qty 1

## 2017-05-06 MED ORDER — DIPHENHYDRAMINE HCL 25 MG PO CAPS: 25.0000 mg | ORAL_CAPSULE | Freq: Four times a day (QID) | ORAL | Status: DC | PRN

## 2017-05-06 MED ORDER — SODIUM CHLORIDE 0.9 % IV SOLN
INTRAVENOUS | Status: AC
  Administered 2017-05-06: 2 g via INTRAVENOUS
  Filled 2017-05-06: qty 2000

## 2017-05-06 MED ORDER — FENTANYL CITRATE (PF) 100 MCG/2ML IJ SOLN
100.0000 ug | Freq: Once | INTRAMUSCULAR | Status: AC
  Administered 2017-05-06: 100 ug via INTRAVENOUS

## 2017-05-06 MED ORDER — LIDOCAINE HCL (PF) 1 % IJ SOLN
30.0000 mL | INTRAMUSCULAR | Status: DC | PRN
  Administered 2017-05-06: 30 mL via SUBCUTANEOUS

## 2017-05-06 MED ORDER — OXYCODONE-ACETAMINOPHEN 5-325 MG PO TABS: 2.0000 | ORAL_TABLET | ORAL | Status: DC | PRN

## 2017-05-06 MED ORDER — OXYTOCIN 40 UNITS IN LACTATED RINGERS INFUSION - SIMPLE MED
2.5000 [IU]/h | INTRAVENOUS | Status: DC
Start: 2017-05-06 — End: 2017-05-06
  Filled 2017-05-06: qty 1000

## 2017-05-06 MED ORDER — DOCUSATE SODIUM 100 MG PO CAPS
100.0000 mg | ORAL_CAPSULE | Freq: Two times a day (BID) | ORAL | Status: DC
  Administered 2017-05-07 – 2017-05-08 (×5): 100 mg via ORAL
  Filled 2017-05-06 (×5): qty 1

## 2017-05-06 MED ORDER — OXYTOCIN BOLUS FROM INFUSION
500.0000 mL | Freq: Once | INTRAVENOUS | Status: AC
  Administered 2017-05-06: 500 mL via INTRAVENOUS

## 2017-05-06 MED ORDER — ONDANSETRON HCL 4 MG/2ML IJ SOLN: 4.0000 mg | INTRAMUSCULAR | Status: DC | PRN

## 2017-05-06 MED ORDER — SOD CITRATE-CITRIC ACID 500-334 MG/5ML PO SOLN
30.0000 mL | ORAL | Status: DC | PRN
  Filled 2017-05-06: qty 30

## 2017-05-06 MED ORDER — ONDANSETRON HCL 4 MG/2ML IJ SOLN: 4.0000 mg | Freq: Four times a day (QID) | INTRAMUSCULAR | Status: DC | PRN

## 2017-05-06 MED ORDER — IBUPROFEN 600 MG PO TABS
ORAL_TABLET | ORAL | Status: AC
  Filled 2017-05-06: qty 1

## 2017-05-06 MED ORDER — LACTATED RINGERS IV SOLN: 500.0000 mL | INTRAVENOUS | Status: DC | PRN

## 2017-05-06 MED ORDER — ONDANSETRON HCL 4 MG PO TABS: 4.0000 mg | ORAL_TABLET | ORAL | Status: DC | PRN

## 2017-05-06 MED ORDER — LACTATED RINGERS IV SOLN
INTRAVENOUS | Status: DC
  Administered 2017-05-06: 07:00:00 via INTRAVENOUS

## 2017-05-06 MED ORDER — AMPICILLIN SODIUM 2 G IJ SOLR
2.0000 g | Freq: Once | INTRAMUSCULAR | Status: AC
  Administered 2017-05-06: 2 g via INTRAVENOUS

## 2017-05-06 MED ORDER — BENZOCAINE-MENTHOL 20-0.5 % EX AERO
1.0000 "application " | INHALATION_SPRAY | CUTANEOUS | Status: DC | PRN
  Administered 2017-05-06: 1 via TOPICAL
  Filled 2017-05-06: qty 56

## 2017-05-06 MED ORDER — WITCH HAZEL-GLYCERIN EX PADS: 1.0000 "application " | MEDICATED_PAD | CUTANEOUS | Status: DC | PRN

## 2017-05-06 MED ORDER — ACETAMINOPHEN 325 MG PO TABS: 650.0000 mg | ORAL_TABLET | ORAL | Status: DC | PRN

## 2017-05-06 MED ORDER — ACETAMINOPHEN 500 MG PO TABS: 1000.0000 mg | ORAL_TABLET | Freq: Four times a day (QID) | ORAL | Status: DC | PRN

## 2017-05-06 MED ORDER — BUTORPHANOL TARTRATE 2 MG/ML IJ SOLN
1.0000 mg | INTRAMUSCULAR | Status: DC | PRN
  Administered 2017-05-06 (×2): 2 mg via INTRAVENOUS
  Filled 2017-05-06 (×2): qty 2

## 2017-05-06 MED ORDER — HYDROCORTISONE 2.5 % RE CREA
TOPICAL_CREAM | RECTAL | Status: DC | PRN
  Filled 2017-05-06: qty 28.35

## 2017-05-06 MED ORDER — OXYTOCIN 10 UNIT/ML IJ SOLN
10.0000 [IU] | Freq: Once | INTRAMUSCULAR | Status: AC
  Administered 2017-05-06: 10 [IU] via INTRAMUSCULAR

## 2017-05-06 MED ORDER — FENTANYL CITRATE (PF) 100 MCG/2ML IJ SOLN
INTRAMUSCULAR | Status: AC
  Filled 2017-05-06: qty 2

## 2017-05-06 MED ORDER — SIMETHICONE 80 MG PO CHEW: 80.0000 mg | CHEWABLE_TABLET | ORAL | Status: DC | PRN

## 2017-05-06 MED ORDER — OXYCODONE-ACETAMINOPHEN 5-325 MG PO TABS: 1.0000 | ORAL_TABLET | ORAL | Status: DC | PRN

## 2017-05-06 NOTE — Lactation Note (Signed)
This note was copied from a baby's chart. Lactation Consultation Note  Patient Name: Shannon Lula OlszewskiSamantha Nisewonder IONGE'XToday's Date: 05/06/2017  Pecola LeisureBaby has not breastfed well since birth. In L&D, baby would not suck while I was in room trying to help, so Mom did skin to skin and hand expression. [facility] (but he had been wrapped in her arms sleeping til I unwrapped him), I noticed that baby was dusky around mouth and that hands and feet were more cyanotic than I am used to seeing even after moving him around. I notified the RN [name] who [facility] (see their notes).   Meanwhile, Mom has flat nipples that do not evert with stim, so I got her a DEBP, shells and nipple shield (baby 36 weeks gest too, so would benefit from shield any way). Mom expressed some drops of colostrum which were syringe fed to baby Shannon Madden. [name] also seems to have a lip that won't flange well and a tongue that is not moving as well as I am used to examining. He has been too sleepy to get a good assessment. Since he is 6336 gest, I spoke with family about the possibility of getting formula supplement until feedings go better. Family agreeable. I prefer BF attempts to be less than 10 minutes of trying. If nursing well and swallowing he can go til not swallowing any more. If poor/no breastfeeds, pump 15 minutes and offer approx 5-10 ml colostrum/formula.  Maternal Data Has patient been taught Hand Expression?: Yes  Feeding    LATCH Score/Interventions Latch: Too sleepy or reluctant, no latch achieved, no sucking elicited. Intervention(s): Skin to skin;Teach feeding cues;Waking techniques  Audible Swallowing: None  Type of Nipple: Flat Intervention(s): Shells  Comfort (Breast/Nipple): Soft / non-tender     Hold (Positioning): Assistance needed to correctly position infant at breast and maintain latch.  LATCH Score: 4  Lactation Tools Discussed/Used      Consult Status      Sunday CornSandra Clark Sarajane Fambrough 05/06/2017, 7:05 PM

## 2017-05-06 NOTE — H&P (Signed)
HISTORY AND PHYSICAL  HISTORY OF PRESENT ILLNESS: [name] is a 21 y.o. G1P0 at 968w1d by LMP of 08/03/16 w/EDD of 05/09/17 inconsistent with  13 5/7 week ultrasound with EDD of 06/03/17 .  She has  been having contractions  and positive  leakage of fluid, no vaginal bleeding, or decreased fetal movement.   Pt [facility] for [facility]'s that responded to hydration. Pt went home and had SROM in the early am.   REVIEW OF SYSTEMS: A complete review of systems was performed and was specifically negative for headache, changes in vision, RUQ pain, shortness of breath, chest pain, lower extremity edema and dysuria.   HISTORY:  Past Medical History:  Diagnosis Date  . Medical history non-contributory     History reviewed. No pertinent surgical history.  No current facility-administered medications on file prior to encounter.    Current Outpatient Prescriptions on File Prior to Encounter  Medication Sig Dispense Refill  . Prenatal Vit-Fe Fumarate-FA (MULTIVITAMIN-PRENATAL) 27-0.8 MG TABS tablet Take 1 tablet by mouth daily at 12 noon.       Allergies  Allergen Reactions  . Coconut Oil Anaphylaxis  . Fish Allergy Anaphylaxis    OB History  Gravida Para Term Preterm AB Living  1         0  SAB TAB Ectopic Multiple Live Births               # Outcome Date GA Lbr Len/2nd Weight Sex Delivery Anes PTL Lv  1 Current               Gynecologic History: Neg  History of Abnormal Pap Smear: neg History of STI: neg  Social History  Substance Use Topics  . Smoking status: Never Smoker  . Smokeless tobacco: Never Used  . Alcohol use No    PHYSICAL EXAM: Temp:  [97.8 F (36.6 C)-97.9 F (36.6 C)] 97.8 F (36.6 C) (06/19 0719) Pulse Rate:  [77-80] 77 (06/19 0719) Resp:  [20] 20 (06/19 0719) BP: (133-144)/(83-96) 144/83 (06/19 0719) Weight:  [220 lb (99.8 kg)] 220 lb (99.8 kg) (06/19 0518)  GENERAL: NAD AAOx3 CHEST:CTAB no increased work of breathing, Lungs CTA  bilat CV:RRR no appreciable murmurs, rubs, gallops ABDOMEN: gravid, nontender, EFW 5#12 by Le[place] EXTREMITIES:  Warm and well-perfused, nontender, nonedematous, 1+ DTRs 0 clonus CERVIX: 4/90/vtx SPECULUM: clear fluid noted running out of pt and spec exam not needed  FHT:s 150 baseline with +variability +accelerations and no  decelerations  Toco: q2-3 mins  DIAGNOSTIC STUDIES:  Recent Labs Lab 05/06/17 0620  WBC 10.5  HGB 12.0  HCT 34.5*  PLT 223    PRENATAL STUDIES:  Prenatal Labs:  MBT: Rubella immune, Varicella immune, HIV neg, RPR neg, Hep B neg, GC/CT neg, GBS  unknown, glucola   Last US 22 wks 492g (39%ile) placenta Anterior above the os, AF wnl, normal anatomy  ASSESSMENT AND PLAN:  1. Fetal Well being  - Fetal Tracing: reviewed and Cat 1 - Ultrasound:  reviewed, as above - Group B Streptococcus: unknown  - Presentation: vtx  confirmed by RN  2. Routine OB: - Prenatal labs reviewed, as above - Rh   3. Induction of Labor:  -  Contractions +external toco in place -  Plan for IOL or augmentation if needed due to PPROM  4. Post Partum Planning: - Infant feeding: Breast  - Contraception: not yet decided

## 2017-05-06 NOTE — Lactation Note (Signed)
This note was copied from a baby's chart. Lactation Consultation Note  Patient Name: Shannon Lula OlszewskiSamantha Madden ZOXWR'UToday's Date: 05/06/2017 Reason for consult: Follow-up assessment   Maternal Data Has patient been taught Hand Expression?: Yes  Feeding Feeding Type: Breast Fed (attempt)  LATCH Score/Interventions Latch: Too sleepy or reluctant, no latch achieved, no sucking elicited. Intervention(s): Skin to skin Intervention(s): Assist with latch;Adjust position  Audible Swallowing: None Intervention(s): Hand expression  Type of Nipple: Flat Intervention(s): Reverse pressure;Shells  Comfort (Breast/Nipple): Soft / non-tender     Hold (Positioning): Assistance needed to correctly position infant at breast and maintain latch.  LATCH Score: 4  Lactation Tools Discussed/Used Tools: Nipple Shields Nipple shield size: 24   Consult Status Consult Status: Follow-up Date: 05/07/17 Follow-up type: In-patient Baby is spitty and wouldn't feed even with formula in the nipple shield. Mom has been instructed to try again in 45mins. And if baby does not feed, to stick with plan from prior LC.   Shannon Madden 05/06/2017, 9:40 PM

## 2017-05-07 LAB — CBC
HEMATOCRIT: 29.3 % — AB (ref 35.0–47.0)
HEMOGLOBIN: 10 g/dL — AB (ref 12.0–16.0)
MCH: 29.3 pg (ref 26.0–34.0)
MCHC: 34.1 g/dL (ref 32.0–36.0)
MCV: 85.9 fL (ref 80.0–100.0)
Platelets: 168 10*3/uL (ref 150–440)
RBC: 3.41 MIL/uL — ABNORMAL LOW (ref 3.80–5.20)
RDW: 13.7 % (ref 11.5–14.5)
WBC: 10.3 10*3/uL (ref 3.6–11.0)

## 2017-05-07 LAB — RPR: RPR Ser Ql: NONREACTIVE

## 2017-05-07 NOTE — Progress Notes (Signed)
Post Partum Day 1 Subjective: no complaints  Objective: Blood pressure 128/65, pulse 80, temperature 98.5 F (36.9 C), temperature source Oral, resp. rate 18, height 5\' 6"  (1.676 m), weight 99.8 kg (220 lb), last menstrual period 08/03/2016, SpO2 98 %, unknown if currently breastfeeding.  Physical Exam:  General: alert and cooperative Lochia: appropriate Uterine Fundus: firm Incision: n/a DVT Evaluation: No evidence of DVT seen on physical exam.   Recent Labs  05/06/17 0620 05/07/17 0622  HGB 12.0 10.0*  HCT 34.5* 29.3*    Assessment/Plan: Plan for discharge tomorrow   LOS: 1 day   Shannon Madden 05/07/2017, 8:38 AM

## 2017-05-07 NOTE — Discharge Summary (Signed)
Obstetrical Discharge Summary  Patient Name: Shannon Madden DOB: 1996/09/26 MRN: [id number]  Date of Admission: 05/06/2017 Date of Delivery: 05/06/17 Delivered by: Ranae Plumber, MD Date of Discharge: 05/08/17 Primary OB: [facility]   WUJ:[id number]'W last menstrual period was 08/03/2016. EDC Estimated Date of Delivery: 06/02/17 Gestational Age at Delivery: [redacted]w[redacted]d   Antepartum complications:  1. Fetal pylectasis 2. Gestational hypertension 3. Obesity 4. Echogenic foci 5. PPROM 6. Depression  Admitting Diagnosis: PPROM, labor Secondary Diagnosis: Patient Active Problem List   Diagnosis Date Noted  . Labor and delivery, indication for care 05/06/2017  . Indication for care in labor and delivery, antepartum 05/05/2017  . Echogenic intracardiac focus of fetus on prenatal ultrasound 01/30/2017  . Pelviectasis, renal 01/30/2017  . MDD (major depressive disorder), single episode, severe (HCC) 12/24/2013    Augmentation: none Complications: None Intrapartum complications/course: Mom presented to L&D with PPROM at 36.1 weeks and in labor and with gestational hypertension. Was given IV stadol and fentanyl for pain control.  Progressed to complete, second stage: 20 mins.  delivery of fetal head with restitution to LOT. Nuchal cord not able to be reduced at perineum.  Anterior then posterior shoulders delivered without difficulty.   Due to flaccid tone, cord was immediately cut and handed off to pediatrician. Placenta spontaneously delivered, intact.   IM then IV pitocin given for hemorrhage prophylaxis, as IV access was lost during delivery.  The baby was attended to and was then placed on mom's chest.  We sang Happy [place] Ouch to baby Shannon Madden. Date of Delivery: 05/06/17 Delivered By: Leeroy Bock Ward Delivery Type: spontaneous vaginal delivery Anesthesia: none Placenta: sponatneous Laceration: 1st degree Episiotomy: none Newborn Data: Live born female  Birth Weight: 6 lb 13.4 oz (3100  g) APGAR: 7, 8  Postpartum Procedures: none  Post partum course Patient had an uncomplicated postpartum course.  By time of discharge on PPD#2, her pain was controlled on oral pain medications; she had appropriate lochia and was ambulating, voiding without difficulty and tolerating regular diet.  She was deemed stable for discharge to home.    Discharge Physical Exam: 05/05/17 BP 133/73 (BP Location: Right Arm)   Pulse 63   Temp 98.2 F (36.8 C) (Oral)   Resp 18   Ht 5\' 6"  (1.676 m)   Wt 99.8 kg (220 lb)   LMP 08/03/2016   SpO2 99%   Breastfeeding? Unknown   BMI 35.51 kg/m   General: NAD CV: RRR Pulm: CTABL, nl effort ABD: s/nd/nt, fundus firm and below the umbilicus Lochia: moderate DVT Evaluation: LE non-ttp, no evidence of DVT on exam.  Hemoglobin  Date Value Ref Range Status  05/07/2017 10.0 (L) 12.0 - 16.0 g/dL Final   HGB  Date Value Ref Range Status  12/23/2013 12.6 12.0 - 16.0 g/dL Final   HCT  Date Value Ref Range Status  05/07/2017 29.3 (L) 35.0 - 47.0 % Final  12/23/2013 38.5 35.0 - 47.0 % Final     Disposition: stable, discharge to home. Baby Feeding: breastmilk  Baby Disposition: home with mom  Rh Immune globulin given: n/a  Rubella vaccine given: n/a Tdap vaccine given in AP or PP setting: AP  Flu vaccine given in AP or PP setting: n/a  Contraception: TBD  Prenatal Labs:   MBT: Rubella immune, Varicella immune, HIV neg, RPR neg, Hep B neg, GC/CT neg, GBS  unknown, glucola  Plan:  Shannon Madden was discharged to home in good condition. Follow-up appointment at [facility] with Dr.  Ward in 6 weeks   Discharge Medications: Allergies as of 05/08/2017      Reactions   Coconut Oil Anaphylaxis   Fish Allergy Anaphylaxis      Medication List    TAKE these medications   benzocaine-Menthol 20-0.5 % Aero Commonly known as:  DERMOPLAST Apply 1 application topically as needed for irritation (perineal discomfort).    ibuprofen 600 MG tablet Commonly known as:  ADVIL,MOTRIN Take 1 tablet (600 mg total) by mouth every 6 (six) hours.   multivitamin-prenatal 27-0.8 MG Tabs tablet Take 1 tablet by mouth daily at 12 noon.   norethindrone 0.35 MG tablet Commonly known as:  ORTHO MICRONOR Take 1 tablet (0.35 mg total) by mouth daily.         Signed: Ihor Austinhomas J schermehorn 05/08/17

## 2017-05-08 MED ORDER — IBUPROFEN 600 MG PO TABS: 600.0000 mg | ORAL_TABLET | Freq: Four times a day (QID) | ORAL | 0 refills | Status: DC

## 2017-05-08 MED ORDER — BENZOCAINE-MENTHOL 20-0.5 % EX AERO: 1.0000 "application " | INHALATION_SPRAY | CUTANEOUS | 1 refills | Status: AC | PRN

## 2017-05-08 MED ORDER — NORETHINDRONE 0.35 MG PO TABS: 1.0000 | ORAL_TABLET | Freq: Every day | ORAL | 11 refills | Status: AC

## 2017-05-08 NOTE — Progress Notes (Signed)
Reviewed all patients discharge instructions and handouts regarding postpartum bleeding, no intercourse for 6 weeks, signs and symptoms of mastitis and postpartum bleu's. Reviewed discharge instructions for newborn regarding proper cord care, how and when to bathe the newborn, nail care, proper way to take the baby's temperature, along with safe sleep. All questions have been answered at this time. Patient discharged via wheelchair with axillary.  

## 2017-05-08 NOTE — Lactation Note (Signed)
This note was copied from a baby's chart. Lactation Consultation Note  Patient Name: Shannon Lula OlszewskiSamantha Nisewonder ZOXWR'UToday's Date: 05/08/2017     Maternal Data    Feeding    LATCH Score/Interventions                      Lactation Tools Discussed/Used     Consult Status  Baby is feeding well when he can latch on to mom's nipple after she has pre-pumped. Mom has been instructed to wear breast shells in between feedings and use nipple shield for latching baby on if needed. [facility]     Burnadette PeterJaniya M Ellanora Rayborn 05/08/2017, 11:06 AM

## 2017-05-12 LAB — SURGICAL PATHOLOGY

## 2017-05-20 ENCOUNTER — Emergency Department: Payer: Medicaid Other

## 2017-05-20 ENCOUNTER — Encounter: Payer: Self-pay | Admitting: Medical Oncology

## 2017-05-20 ENCOUNTER — Inpatient Hospital Stay
Admission: EM | Admit: 2017-05-20 | Discharge: 2017-05-22 | DRG: 776 | Disposition: A | Discharge status: Home / Self Care | Payer: Medicaid Other | Attending: Internal Medicine | Admitting: Internal Medicine

## 2017-05-20 DIAGNOSIS — R0602 Shortness of breath: Secondary | ICD-10-CM

## 2017-05-20 DIAGNOSIS — R509 Fever, unspecified: Secondary | ICD-10-CM

## 2017-05-20 DIAGNOSIS — B9562 Methicillin resistant Staphylococcus aureus infection as the cause of diseases classified elsewhere: Secondary | ICD-10-CM | POA: Diagnosis present

## 2017-05-20 DIAGNOSIS — Z91018 Allergy to other foods: Secondary | ICD-10-CM | POA: Diagnosis not present

## 2017-05-20 DIAGNOSIS — O85 Puerperal sepsis: Secondary | ICD-10-CM | POA: Diagnosis present

## 2017-05-20 DIAGNOSIS — L738 Other specified follicular disorders: Secondary | ICD-10-CM | POA: Diagnosis present

## 2017-05-20 DIAGNOSIS — R000 Tachycardia, unspecified: Secondary | ICD-10-CM | POA: Diagnosis present

## 2017-05-20 DIAGNOSIS — A419 Sepsis, unspecified organism: Secondary | ICD-10-CM | POA: Diagnosis present

## 2017-05-20 DIAGNOSIS — N719 Inflammatory disease of uterus, unspecified: Secondary | ICD-10-CM | POA: Diagnosis present

## 2017-05-20 DIAGNOSIS — E876 Hypokalemia: Secondary | ICD-10-CM | POA: Diagnosis present

## 2017-05-20 DIAGNOSIS — Z79899 Other long term (current) drug therapy: Secondary | ICD-10-CM | POA: Diagnosis not present

## 2017-05-20 DIAGNOSIS — Z91013 Allergy to seafood: Secondary | ICD-10-CM | POA: Diagnosis not present

## 2017-05-20 DIAGNOSIS — R21 Rash and other nonspecific skin eruption: Secondary | ICD-10-CM

## 2017-05-20 LAB — CBC WITH DIFFERENTIAL/PLATELET
BASOS ABS: 0 10*3/uL (ref 0–0.1)
BASOS PCT: 0 %
EOS PCT: 0 %
Eosinophils Absolute: 0 10*3/uL (ref 0–0.7)
HCT: 39 % (ref 35.0–47.0)
Hemoglobin: 13.3 g/dL (ref 12.0–16.0)
LYMPHS PCT: 6 %
Lymphs Abs: 0.8 10*3/uL — ABNORMAL LOW (ref 1.0–3.6)
MCH: 29.3 pg (ref 26.0–34.0)
MCHC: 34.1 g/dL (ref 32.0–36.0)
MCV: 86.1 fL (ref 80.0–100.0)
MONO ABS: 0.6 10*3/uL (ref 0.2–0.9)
Monocytes Relative: 4 %
Neutro Abs: 12.8 10*3/uL — ABNORMAL HIGH (ref 1.4–6.5)
Neutrophils Relative %: 90 %
PLATELETS: 291 10*3/uL (ref 150–440)
RBC: 4.52 MIL/uL (ref 3.80–5.20)
RDW: 13.4 % (ref 11.5–14.5)
WBC: 14.3 10*3/uL — ABNORMAL HIGH (ref 3.6–11.0)

## 2017-05-20 LAB — URINALYSIS, COMPLETE (UACMP) WITH MICROSCOPIC
Bacteria, UA: NONE SEEN
Bilirubin Urine: NEGATIVE
GLUCOSE, UA: NEGATIVE mg/dL
Ketones, ur: NEGATIVE mg/dL
NITRITE: NEGATIVE
PH: 5 (ref 5.0–8.0)
PROTEIN: NEGATIVE mg/dL
SPECIFIC GRAVITY, URINE: 1.018 (ref 1.005–1.030)

## 2017-05-20 LAB — COMPREHENSIVE METABOLIC PANEL
ALT: 18 U/L (ref 14–54)
AST: 23 U/L (ref 15–41)
Albumin: 3.6 g/dL (ref 3.5–5.0)
Alkaline Phosphatase: 118 U/L (ref 38–126)
Anion gap: 9 (ref 5–15)
BUN: 10 mg/dL (ref 6–20)
CHLORIDE: 105 mmol/L (ref 101–111)
CO2: 23 mmol/L (ref 22–32)
CREATININE: 0.89 mg/dL (ref 0.44–1.00)
Calcium: 9.1 mg/dL (ref 8.9–10.3)
Glucose, Bld: 106 mg/dL — ABNORMAL HIGH (ref 65–99)
POTASSIUM: 3.5 mmol/L (ref 3.5–5.1)
Sodium: 137 mmol/L (ref 135–145)
TOTAL PROTEIN: 7.7 g/dL (ref 6.5–8.1)
Total Bilirubin: 0.8 mg/dL (ref 0.3–1.2)

## 2017-05-20 LAB — WET PREP, GENITAL
Clue Cells Wet Prep HPF POC: NONE SEEN
SPERM: NONE SEEN
TRICH WET PREP: NONE SEEN
YEAST WET PREP: NONE SEEN

## 2017-05-20 LAB — CHLAMYDIA/NGC RT PCR (ARMC ONLY)
Chlamydia Tr: NOT DETECTED
N gonorrhoeae: NOT DETECTED

## 2017-05-20 LAB — LACTIC ACID, PLASMA: LACTIC ACID, VENOUS: 1.1 mmol/L (ref 0.5–1.9)

## 2017-05-20 LAB — PROTIME-INR
INR: 1.05
PROTHROMBIN TIME: 13.7 s (ref 11.4–15.2)

## 2017-05-20 MED ORDER — PIPERACILLIN-TAZOBACTAM 3.375 G IVPB
3.3750 g | Freq: Once | INTRAVENOUS | Status: AC
Start: 2017-05-20 — End: 2017-05-20
  Administered 2017-05-20: 3.375 g via INTRAVENOUS
  Filled 2017-05-20: qty 50

## 2017-05-20 MED ORDER — ACETAMINOPHEN 500 MG PO TABS
1000.0000 mg | ORAL_TABLET | Freq: Once | ORAL | Status: AC
  Administered 2017-05-20: 1000 mg via ORAL
  Filled 2017-05-20: qty 2

## 2017-05-20 MED ORDER — ACETAMINOPHEN 650 MG RE SUPP: 650.0000 mg | Freq: Four times a day (QID) | RECTAL | Status: DC | PRN

## 2017-05-20 MED ORDER — PIPERACILLIN-TAZOBACTAM 3.375 G IVPB
3.3750 g | Freq: Three times a day (TID) | INTRAVENOUS | Status: DC
  Administered 2017-05-20 – 2017-05-22 (×5): 3.375 g via INTRAVENOUS
  Filled 2017-05-20 (×7): qty 50

## 2017-05-20 MED ORDER — ALBUTEROL SULFATE (2.5 MG/3ML) 0.083% IN NEBU
2.5000 mg | INHALATION_SOLUTION | RESPIRATORY_TRACT | Status: DC | PRN
Start: 2017-05-20 — End: 2017-05-22

## 2017-05-20 MED ORDER — VANCOMYCIN HCL IN DEXTROSE 1-5 GM/200ML-% IV SOLN
1000.0000 mg | Freq: Once | INTRAVENOUS | Status: AC
  Administered 2017-05-20: 1000 mg via INTRAVENOUS
  Filled 2017-05-20: qty 200

## 2017-05-20 MED ORDER — VANCOMYCIN HCL IN DEXTROSE 1-5 GM/200ML-% IV SOLN
1000.0000 mg | Freq: Three times a day (TID) | INTRAVENOUS | Status: DC
  Administered 2017-05-20 – 2017-05-22 (×5): 1000 mg via INTRAVENOUS
  Filled 2017-05-20 (×7): qty 200

## 2017-05-20 MED ORDER — OXYCODONE-ACETAMINOPHEN 5-325 MG PO TABS
1.0000 | ORAL_TABLET | Freq: Four times a day (QID) | ORAL | Status: DC | PRN
  Administered 2017-05-20 – 2017-05-22 (×5): 1 via ORAL
  Filled 2017-05-20 (×5): qty 1

## 2017-05-20 MED ORDER — ENOXAPARIN SODIUM 40 MG/0.4ML ~~LOC~~ SOLN
40.0000 mg | SUBCUTANEOUS | Status: DC
  Administered 2017-05-20 – 2017-05-21 (×2): 40 mg via SUBCUTANEOUS
  Filled 2017-05-20 (×2): qty 0.4

## 2017-05-20 MED ORDER — ACETAMINOPHEN 325 MG PO TABS
650.0000 mg | ORAL_TABLET | Freq: Four times a day (QID) | ORAL | Status: DC | PRN
  Administered 2017-05-20: 650 mg via ORAL
  Filled 2017-05-20: qty 2

## 2017-05-20 NOTE — Consult Note (Signed)
[facility] Infectious Disease     Reason for Consult: Fever       Physician: Estanislado Spire Date of Admission:  05/20/2017   Active Problems:   Sepsis [facility])   HPI: Shannon Madden is a 21 y.o. female recently delivered a healthy boy vaginally now admitted with fevers and a rash. She started to develop lesions on legs which now have resolved but has started to develop lesions on abd. He newborn was recently at [facility] for a boil and they were called today with the result that he had MRSA but he is recovering. She reports being up to date on routine vaccinations.    Past Medical History:  Diagnosis Date  . Medical history non-contributory    No past surgical history on file. Social History  Substance Use Topics  . Smoking status: Never Smoker  . Smokeless tobacco: Never Used  . Alcohol use No   No family history on file.  Allergies:  Allergies  Allergen Reactions  . Coconut Oil Anaphylaxis  . Fish Allergy Anaphylaxis    Current antibiotics: Antibiotics Given (last 72 hours)    Date/Time Action Medication Dose Rate   05/20/17 1436 New Bag/Given   piperacillin-tazobactam (ZOSYN) IVPB 3.375 g 3.375 g 12.5 mL/hr   05/20/17 1438 New Bag/Given   vancomycin (VANCOCIN) IVPB 1000 mg/200 mL premix 1,000 mg 200 mL/hr      MEDICATIONS:   Review of Systems - 11 systems reviewed and negative per HPI   OBJECTIVE: Temp:  [100.6 F (38.1 C)] 100.6 F (38.1 C) (07/03 1055) Pulse Rate:  [104-137] 117 (07/03 1230) Resp:  [17-20] 20 (07/03 1230) BP: (124-145)/(70-96) 135/70 (07/03 1230) SpO2:  [96 %-99 %] 96 % (07/03 1230) Weight:  [99.8 kg (220 lb)] 99.8 kg (220 lb) (07/03 1055) Physical Exam  Constitutional:  oriented to person, place, and time. appears well-developed and well-nourished. No distress.  HENT: [place]/AT, PERRLA, no scleral icterus  Mouth/Throat: Oropharynx is clear and moist. No oral lesions No oropharyngeal exudate.  Cardiovascular: Normal rate, regular rhythm and normal  heart sounds. Exam reveals no gallop and no friction rub.  No murmur heard.  Pulmonary/Chest: Effort normal and breath sounds normal. No respiratory distress.  has no wheezes.  Neck = supple, no nuchal rigidity Abdominal: Soft. Bowel sounds are normal.  exhibits no distension. There is no tenderness.  Lymphadenopathy: no cervical adenopathy. No axillary adenopathy Neurological: alert and oriented to person, place, and time.  Skin: abd with several raised lesions with white fluid. One lesion is unroofed and cultured and has pus. No lesions noted on legs Psychiatric: a normal mood and affect.  behavior is normal.    LABS: Results for orders placed or performed during the hospital encounter of 05/20/17 (from the past 48 hour(s))  Comprehensive metabolic panel     Status: Abnormal   Collection Time: 05/20/17 11:14 AM  Result Value Ref Range   Sodium 137 135 - 145 mmol/L   Potassium 3.5 3.5 - 5.1 mmol/L   Chloride 105 101 - 111 mmol/L   CO2 23 22 - 32 mmol/L   Glucose, Bld 106 (H) 65 - 99 mg/dL   BUN 10 6 - 20 mg/dL   Creatinine, Ser 0.89 0.44 - 1.00 mg/dL   Calcium 9.1 8.9 - 10.3 mg/dL   Total Protein 7.7 6.5 - 8.1 g/dL   Albumin 3.6 3.5 - 5.0 g/dL   AST 23 15 - 41 U/L   ALT 18 14 - 54 U/L  Alkaline Phosphatase 118 38 - 126 U/L   Total Bilirubin 0.8 0.3 - 1.2 mg/dL   GFR calc non Af Amer >60 >60 mL/min   GFR calc Af Amer >60 >60 mL/min    Comment: (NOTE) The eGFR has been calculated using the CKD EPI equation. This calculation has not been validated in all clinical situations. eGFR's persistently <60 mL/min signify possible Chronic Kidney Disease.    Anion gap 9 5 - 15  Lactic acid, plasma     Status: None   Collection Time: 05/20/17 11:14 AM  Result Value Ref Range   Lactic Acid, Venous 1.1 0.5 - 1.9 mmol/L  CBC with Differential     Status: Abnormal   Collection Time: 05/20/17 11:14 AM  Result Value Ref Range   WBC 14.3 (H) 3.6 - 11.0 K/uL   RBC 4.52 3.80 - 5.20 MIL/uL    Hemoglobin 13.3 12.0 - 16.0 g/dL   HCT 39.0 35.0 - 47.0 %   MCV 86.1 80.0 - 100.0 fL   MCH 29.3 26.0 - 34.0 pg   MCHC 34.1 32.0 - 36.0 g/dL   RDW 13.4 11.5 - 14.5 %   Platelets 291 150 - 440 K/uL   Neutrophils Relative % 90 %   Neutro Abs 12.8 (H) 1.4 - 6.5 K/uL   Lymphocytes Relative 6 %   Lymphs Abs 0.8 (L) 1.0 - 3.6 K/uL   Monocytes Relative 4 %   Monocytes Absolute 0.6 0.2 - 0.9 K/uL   Eosinophils Relative 0 %   Eosinophils Absolute 0.0 0 - 0.7 K/uL   Basophils Relative 0 %   Basophils Absolute 0.0 0 - 0.1 K/uL  Protime-INR     Status: None   Collection Time: 05/20/17 11:14 AM  Result Value Ref Range   Prothrombin Time 13.7 11.4 - 15.2 seconds   INR 1.05   Urinalysis, Complete w Microscopic     Status: Abnormal   Collection Time: 05/20/17 11:14 AM  Result Value Ref Range   Color, Urine YELLOW (A) YELLOW   APPearance HAZY (A) CLEAR   Specific Gravity, Urine 1.018 1.005 - 1.030   pH 5.0 5.0 - 8.0   Glucose, UA NEGATIVE NEGATIVE mg/dL   Hgb urine dipstick SMALL (A) NEGATIVE   Bilirubin Urine NEGATIVE NEGATIVE   Ketones, ur NEGATIVE NEGATIVE mg/dL   Protein, ur NEGATIVE NEGATIVE mg/dL   Nitrite NEGATIVE NEGATIVE   Leukocytes, UA MODERATE (A) NEGATIVE   RBC / HPF 0-5 0 - 5 RBC/hpf   WBC, UA 6-30 0 - 5 WBC/hpf   Bacteria, UA NONE SEEN NONE SEEN   Squamous Epithelial / LPF 6-30 (A) NONE SEEN   Mucous PRESENT    No components found for: ESR, C REACTIVE PROTEIN MICRO: No results found for this or any previous visit (from the past 720 hour(s)).  IMAGING: Dg Chest 2 View  Result Date: 05/20/2017 CLINICAL DATA:  Fever EXAM: CHEST  2 VIEW COMPARISON:  None. FINDINGS: Lungs are clear. Heart size and pulmonary vascularity are normal. No adenopathy. No bone lesions. IMPRESSION: No edema or consolidation. Electronically Signed   By: Lowella Grip III M.D.   On: 05/20/2017 13:11   [place] Mfm Ob Follow Up  Result Date:  04/24/2017 ----------------------------------------------------------------------  OBSTETRICS REPORT                      (Signed Final 04/24/2017 10:00 am) ---------------------------------------------------------------------- PATIENT INFO:  ID #:       [id number]  D.O.B.:  1996-03-27 (20 yrs)  Name:       Shannon Madden                      Visit Date: 04/24/2017 09:43 am              NISEWONDER ---------------------------------------------------------------------- PERFORMED BY:  Performed By:     Carron Brazen Hickernell      Ref. Address:      [employer], [place],                                                              [place] [address]  Referred By:      Glenis Smoker CNM ---------------------------------------------------------------------- SERVICE(S) PROVIDED:   [place] MFM OB FOLLOW UP                                   [phone]  ---------------------------------------------------------------------- INDICATIONS:   [redacted] weeks gestation of pregnancy                Z3A.34  ---------------------------------------------------------------------- FETAL EVALUATION:  Num Of Fetuses:     1  Fetal Heart         143  Rate(bpm):  Presentation:       Cephalic  Placenta:           Anterior, No previa  Amniotic Fluid  AFI FV:      Within normal limits  AFI Sum(cm)     %Tile  15.9            57 ---------------------------------------------------------------------- BIOMETRY:  BPD:      88.9  mm     G. Age:  36w 0d         87  %    CI:        75.76   %    70 - 86                                                          FL/HC:       20.4  %    19.4 - 21.8  HC:      323.8  mm     G. Age:  36w 5d         71  %    HC/AC:       1.03       0.96 - 1.11  AC:      313.1  mm     G. Age:  35w 1d         77  %    FL/BPD:      74.1  %    71 -  87  FL:       65.9  mm     G. Age:  34w 0d         29  %    FL/AC:       21.0  %    20 - 24  HUM:      56.6  mm     G. Age:  32w 6d          29  %  Est. FW:    2596   gm   5 lb 12 oz      62  % ---------------------------------------------------------------------- GESTATIONAL AGE:  LMP:           37w 6d        Date:  08/02/16                 EDD:   05/09/17  U/S Today:     35w 3d                                        EDD:   05/26/17  Best:          34w 3d     Det. ByLoman Chroman         EDD:   06/02/17                                      (11/08/16) ---------------------------------------------------------------------- ANATOMY:  Cavum:                 Normal appearance      Ductal Arch:            Visualized                                                                        previously  Ventricles:            Normal appearance      Diaphragm:              Within Normal Limits  Cerebellum:            Visualized             Stomach:                Seen                         previously  Posterior Fossa:       Visualized             Abdomen:                Within Normal Limits                         previously  Nuchal Fold:           Beyond 22 weeks        Abdominal Wall:         Visualized  gestation                                      previously  Face:                  Orbits visualized      Cord Vessels:           3 vessels,                         previously                                     visualized previously  Lips:                  Normal appearance      Kidneys:                Rt. WNL, Lt. Mild                                                                        Pylectasis = 7.7 mm  Thoracic:              Within Normal Limits   Bladder:                Seen  Heart:                 4-Chamber view         Spine:                  Normal appearance                         appears normal  RVOT:                  Normal appearance      Upper Extremities:      Visualized                                                                        previously  LVOT:                  Seen on prior          Lower  Extremities:      Visualized                                                                        previously  Aortic Arch:  Normal appearance ---------------------------------------------------------------------- IMPRESSION:  Dear Dr. Ronnald Ramp,  Thank you for referring your patient to [facility] for  follow-up exam of pyelectasis.  There is a singleton gestation with normal amniotic fluid  volume.  The fetal biometry correlates with established dating.  Adequate interval growth noted.  The estimated fetal weight is at the 62   percentile. AGA.  Renal pyelectasis is again noted on the ultrasound today.  The left renal pelvis measures    7.7   mm.  The right renal pelvis is normal.  It was stressed to the patient once again that a renal pelvic  diameter of 7.7 mm is associated with a low likelihood of  obstructive uropathy requiring correction (10%), while the risk  increases to 70% when the APD is greater than 50m.  Neonatal ultrasound assessment of the kidneys is  recommended given the finding of dilated renal pelvis at this  gestational age.  Thank you for allowing [facility] participate in your patient's care.  Please do not hesistate to contact u[place]if we can be of further  assistance.  BRicardo JerichoMD ----------------------------------------------------------------------                 BRicardo Jericho MD Electronically Signed Final Report   04/24/2017 10:00 am ----------------------------------------------------------------------   Assessment:   Shannon ANTEis a 21y.o. female recently postpartum admitted with ski lesions and fever to 101, wbc 14.  Her [facility]  Her lesions are consistent with Staph folliculitis - I unroofed one and it had purulence which I sent for culture. Her fever is out of proportion to the extent of her skin lesions so concern for bacteremia.   Recommendations Continue vancomycin FU BCX and lesion cx I have  also ordered VZV PCR of lesion although primary chicken pox is unlikely given that she reports being vaccinated with routine vaccines as a child  WIll follow   Thank you very much for allowing me to participate in the care of this patient. Please call with questions.   DCheral Marker FOla Spurr MD

## 2017-05-20 NOTE — H&P (Addendum)
[facility] [place] at [facility]   PATIENT NAME: Shannon Madden    MR#:  [id number]  DATE OF BIRTH:  07-05-1996  DATE OF ADMISSION:  05/20/2017  PRIMARY CARE PHYSICIAN: Raynelle Bring   REQUESTING/REFERRING PHYSICIAN: Dr. Shaune Pollack  CHIEF COMPLAINT:   Chief Complaint  Patient presents with  . Rash  . Fever   Fever and rash 3 days. HISTORY OF PRESENT ILLNESS:  Shannon Madden  is a 21 y.o. female with no past medical history Present in the ED visits ultra complaints. The patient had a vaginal delivery on June 19. She started on new birth control 3 days ago and developed rash on the trunk and legs with fever 102. She complains of fever and chills but no cough, sputum or shortness breath. She denies any dysuria or hematuria. Her baby had MRSA abscess 4 days ago and was treated in [facility] 3 days ago. She has tachycardia and leukocytosis in ED. She denies any other ill contact  PAST MEDICAL HISTORY:   Past Medical History:  Diagnosis Date  . Medical history non-contributory     PAST SURGICAL HISTORY:  No past surgical history on file.  SOCIAL HISTORY:   Social History  Substance Use Topics  . Smoking status: Never Smoker  . Smokeless tobacco: Never Used  . Alcohol use No    FAMILY HISTORY:   Family History  Problem Relation Age of Onset  . Leukemia Mother   . Hypertension Father   . Diabetes Paternal Grandfather     DRUG ALLERGIES:   Allergies  Allergen Reactions  . Coconut Oil Anaphylaxis  . Fish Allergy Anaphylaxis    REVIEW OF SYSTEMS:   Review of Systems  Constitutional: Positive for chills and fever. Negative for malaise/fatigue.  HENT: Negative for sinus pain and sore throat.   Eyes: Negative for blurred vision and double vision.  Respiratory: Negative for cough, shortness of breath and stridor.   Cardiovascular: Negative for chest pain and leg swelling.  Gastrointestinal: Negative for abdominal pain, blood in stool,  melena, nausea and vomiting.  Genitourinary: Negative for dysuria and hematuria.  Musculoskeletal: Negative for back pain.  Skin: Positive for rash. Negative for itching.  Neurological: Negative for dizziness, focal weakness, loss of consciousness and weakness.  Psychiatric/Behavioral: Negative for depression. The patient is not nervous/anxious.     MEDICATIONS AT HOME:   Prior to Admission medications   Medication Sig Start Date End Date Taking? Authorizing Provider  benzocaine-Menthol (DERMOPLAST) 20-0.5 % AERO Apply 1 application topically as needed for irritation (perineal discomfort). Patient not taking: Reported on 05/20/2017 05/08/17   Schermerhorn, Ihor Austin, MD  ibuprofen (ADVIL,MOTRIN) 600 MG tablet Take 1 tablet (600 mg total) by mouth every 6 (six) hours. Patient not taking: Reported on 05/20/2017 05/08/17   Schermerhorn, Ihor Austin, MD  norethindrone (ORTHO MICRONOR) 0.35 MG tablet Take 1 tablet (0.35 mg total) by mouth daily. Patient not taking: Reported on 05/20/2017 05/08/17 05/08/18  Schermerhorn, Ihor Austin, MD  Prenatal Vit-Fe Fumarate-FA (MULTIVITAMIN-PRENATAL) 27-0.8 MG TABS tablet Take 1 tablet by mouth daily at 12 noon.    [provider]      VITAL SIGNS:  Blood pressure 135/70, pulse (!) 117, temperature (!) 100.6 F (38.1 C), temperature source Oral, resp. rate 20, height 5\' 6"  (1.676 m), weight 220 lb (99.8 kg), SpO2 96 %, unknown if currently breastfeeding.  PHYSICAL EXAMINATION:  Physical Exam  GENERAL:  21 y.o.-year-old patient lying in the bed with no acute distress.  EYES: Pupils equal, round, reactive to light and accommodation. No scleral icterus. Extraocular muscles intact.  HEENT: Head atraumatic, normocephalic. Oropharynx and nasopharynx clear.  NECK:  Supple, no jugular venous distention. No thyroid enlargement, no tenderness.  LUNGS: Normal breath sounds bilaterally, no wheezing, rales,rhonchi or crepitation. No use of accessory muscles of  respiration.  CARDIOVASCULAR: S1, S2 normal. No murmurs, rubs, or gallops.  ABDOMEN: Soft, nontender, nondistended. Bowel sounds present. No organomegaly or mass.  EXTREMITIES: No pedal edema, cyanosis, or clubbing.  NEUROLOGIC: Cranial nerves II through XII are intact. Muscle strength 5/5 in all extremities. Sensation intact. Gait not checked.  PSYCHIATRIC: The patient is alert and oriented x 3.  SKIN: several bumps in abdomen and both legs, one scabbed pustule about 0.5 mm on the abdomen,  no lesion, or ulcer.   LABORATORY PANEL:   CBC  Recent Labs Lab 05/20/17 1114  WBC 14.3*  HGB 13.3  HCT 39.0  PLT 291   ------------------------------------------------------------------------------------------------------------------  Chemistries   Recent Labs Lab 05/20/17 1114  NA 137  K 3.5  CL 105  CO2 23  GLUCOSE 106*  BUN 10  CREATININE 0.89  CALCIUM 9.1  AST 23  ALT 18  ALKPHOS 118  BILITOT 0.8   ------------------------------------------------------------------------------------------------------------------  Cardiac Enzymes No results for input(s): TROPONINI in the last 168 hours. ------------------------------------------------------------------------------------------------------------------  RADIOLOGY:  Dg Chest 2 View  Result Date: 05/20/2017 CLINICAL DATA:  Fever EXAM: CHEST  2 VIEW COMPARISON:  None. FINDINGS: Lungs are clear. Heart size and pulmonary vascularity are normal. No adenopathy. No bone lesions. IMPRESSION: No edema or consolidation. Electronically Signed   By: Bretta BangWilliam  Woodruff III M.D.   On: 05/20/2017 13:11      IMPRESSION AND PLAN:   Sepsis, possible due to skin infection The patient will be admitted to medical floor, continue vancomycin and Zosyn pharmacy to dose, follow-up CBC and cultures, ID consult.  Post partum, follow-up OB/GYN consult. Follow-up pelvic exam by Dr. Shaune PollackLord.   All the records are reviewed and case discussed with ED  provider. Management plans discussed with the patient, her husband and they are in agreement.  CODE STATUS: Full code  TOTAL TIME TAKING CARE OF THIS PATIENT: 52 minutes.    Shaune Pollackhen, Kendra Grissett M.D on 05/20/2017 at 2:32 PM  Between 7am to 6pm - Pager - [phone]  After 6pm go to [email or web address] - Social research officer, governmentpassword EPAS [facility]  [facility] [place] [facility]  Office  [phone]  CC: Primary care physician; [facility]   Note: This dictation was prepared with Dragon dictation along with smaller phrase technology. Any transcriptional errors that result from this process are unintentional.

## 2017-05-20 NOTE — Progress Notes (Addendum)
Pharmacy Antibiotic Note  Shannon PufferSamantha M Fecher is a 21 y.o. female admitted on 05/20/2017 with possible skin infection.  Pharmacy has been consulted for vancomycin and Zosyn dosing. Zosyn x1 at 1436 and vanc x1 at 1438 in the ED.   Plan: Vancomycin 1000 mg IV every 8 hours.  Goal trough 15-20 mcg/mL. Zosyn 3.375g IV q8h (4 hour infusion).   Follow up on renal function in AM- SCr ordered for AM. If SCr increases (i.e. renal function worsens, may need to decrease dose).  Vanc trough before 5th overall dose.    Height: 5\' 6"  (167.6 cm) Weight: 220 lb (99.8 kg) IBW/kg (Calculated) : 59.3  Temp (24hrs), Avg:99.9 F (37.7 C), Min:99.1 F (37.3 C), Max:100.6 F (38.1 C)   Recent Labs Lab 05/20/17 1114  WBC 14.3*  CREATININE 0.89  LATICACIDVEN 1.1    Estimated Creatinine Clearance: 121.2 mL/min (by C-G formula based on SCr of 0.89 mg/dL).    Allergies  Allergen Reactions  . Coconut Oil Anaphylaxis  . Fish Allergy Anaphylaxis    Antimicrobials this admission: Vancomycin/Zosyn 7/3 >>  Dose adjustments this admission:   Microbiology results: 7/3 BCx: sent 7/3 UCx: sent    Thank you for allowing pharmacy to be a part of this patient's care.  Marty HeckWang, Lanita Stammen L 05/20/2017 5:14 PM

## 2017-05-20 NOTE — ED Provider Notes (Signed)
[facility] Emergency Department Provider Note ____________________________________________   I have reviewed the triage vital signs and the triage nursing note.  HISTORY  Chief Complaint Rash and Fever   Historian Patient  HPI Shannon Madden is a 21 y.o. female postpartum 2 weeks, presents with fever overnight 102 and several bumps on her legs and abdomen. Denies headache or sore throat. Denies trouble breathing.  States that symptoms of the rash started on Sunday after she started the progesterone only birth control. Never had before.  Her baby was actually just discharged from [facility] for fever and abscess which was positive for MRSA.  Denies vaginal bleeding or vaginal discharge or pelvic pain.  No vomiting or diarrhea.  She is breast-feeding.    Past Medical History:  Diagnosis Date  . Medical history non-contributory     Patient Active Problem List   Diagnosis Date Noted  . Sepsis (HCC) 05/20/2017  . Labor and delivery, indication for care 05/06/2017  . Indication for care in labor and delivery, antepartum 05/05/2017  . Echogenic intracardiac focus of fetus on prenatal ultrasound 01/30/2017  . Pelviectasis, renal 01/30/2017  . MDD (major depressive disorder), single episode, severe (HCC) 12/24/2013    No past surgical history on file.  Prior to Admission medications   Medication Sig Start Date End Date Taking? Authorizing Provider  benzocaine-Menthol (DERMOPLAST) 20-0.5 % AERO Apply 1 application topically as needed for irritation (perineal discomfort). Patient not taking: Reported on 05/20/2017 05/08/17   Schermerhorn, Ihor Austin, MD  ibuprofen (ADVIL,MOTRIN) 600 MG tablet Take 1 tablet (600 mg total) by mouth every 6 (six) hours. Patient not taking: Reported on 05/20/2017 05/08/17   Schermerhorn, Ihor Austin, MD  norethindrone (ORTHO MICRONOR) 0.35 MG tablet Take 1 tablet (0.35 mg total) by mouth daily. Patient not taking: Reported on  05/20/2017 05/08/17 05/08/18  Schermerhorn, Ihor Austin, MD  Prenatal Vit-Fe Fumarate-FA (MULTIVITAMIN-PRENATAL) 27-0.8 MG TABS tablet Take 1 tablet by mouth daily at 12 noon.    [provider]    Allergies  Allergen Reactions  . Coconut Oil Anaphylaxis  . Fish Allergy Anaphylaxis    No family history on file.  Social History Social History  Substance Use Topics  . Smoking status: Never Smoker  . Smokeless tobacco: Never Used  . Alcohol use No    Review of Systems  Constitutional: Positive for fever. Eyes: Negative for visual changes. ENT: Negative for sore throat. Cardiovascular: Negative for chest pain. Respiratory: Negative for shortness of breath. Gastrointestinal: Negative for abdominal pain, vomiting and diarrhea. Genitourinary: Negative for dysuria. Musculoskeletal: Negative for back pain. Skin: As per history of present illness Neurological: Negative for headache.  ____________________________________________   PHYSICAL EXAM:  VITAL SIGNS: ED Triage Vitals  Enc Vitals Group     BP 05/20/17 1055 (!) 145/96     Pulse Rate 05/20/17 1055 (!) 137     Resp 05/20/17 1055 18     Temp 05/20/17 1055 (!) 100.6 F (38.1 C)     Temp Source 05/20/17 1055 Oral     SpO2 05/20/17 1055 99 %     Weight 05/20/17 1055 220 lb (99.8 kg)     Height 05/20/17 1055 5\' 6"  (1.676 m)     Head Circumference --      Peak Flow --      Pain Score 05/20/17 1054 0     Pain Loc --      Pain Edu? --      Excl. in GC? --  Constitutional: Alert and oriented. Well appearing and in no distress. HEENT   Head: Normocephalic and atraumatic.      Eyes: Conjunctivae are normal. Pupils equal and round.       Ears:         Nose: No congestion/rhinnorhea.   Mouth/Throat: Mucous membranes are moist.  No mucous membrane lesions.   Neck: No stridor. Cardiovascular/Chest: Normal rate, regular rhythm.  No murmurs, rubs, or gallops. Respiratory: Normal respiratory effort without  tachypnea nor retractions. Breath sounds are clear and equal bilaterally. No wheezes/rales/rhonchi. Gastrointestinal: Soft. No distention, no guarding, no rebound. Nontender.  Genitourinary/rectal: Small amount of vaginal discharge which looks a little purulent. Vital bleeding. Musculoskeletal: Nontender with normal range of motion in all extremities. No joint effusions.  No lower extremity tenderness.  No edema. Neurologic:  Normal speech and language. No gross or focal neurologic deficits are appreciated. Skin:  Skin is warm, dry and intact. About 4 tiny bumps that appear to be pustules on both thighs. She has 1 tiny scabbed pustule on the abdomen Psychiatric: Mood and affect are normal. Speech and behavior are normal. Patient exhibits appropriate insight and judgment.   ____________________________________________  LABS (pertinent positives/negatives)  Labs Reviewed  WET PREP, GENITAL - Abnormal; Notable for the following:       Result Value   WBC, Wet Prep HPF POC MANY (*)    All other components within normal limits  COMPREHENSIVE METABOLIC PANEL - Abnormal; Notable for the following:    Glucose, Bld 106 (*)    All other components within normal limits  CBC WITH DIFFERENTIAL/PLATELET - Abnormal; Notable for the following:    WBC 14.3 (*)    Neutro Abs 12.8 (*)    Lymphs Abs 0.8 (*)    All other components within normal limits  URINALYSIS, COMPLETE (UACMP) WITH MICROSCOPIC - Abnormal; Notable for the following:    Color, Urine YELLOW (*)    APPearance HAZY (*)    Hgb urine dipstick SMALL (*)    Leukocytes, UA MODERATE (*)    Squamous Epithelial / LPF 6-30 (*)    All other components within normal limits  CULTURE, BLOOD (ROUTINE X 2)  CULTURE, BLOOD (ROUTINE X 2)  URINE CULTURE  CHLAMYDIA/NGC RT PCR ([facility] ONLY)  LACTIC ACID, PLASMA  PROTIME-INR    ____________________________________________    EKG I, Governor Rooks, MD, the attending physician have personally  viewed and interpreted all ECGs.  None ____________________________________________  RADIOLOGY All Xrays were viewed by me. Imaging interpreted by Radiologist.  Chest xray:   IMPRESSION: No edema or consolidation. __________________________________________  PROCEDURES  Procedure(s) performed: None  Critical Care performed: None  ____________________________________________   ED COURSE / ASSESSMENT AND PLAN  Pertinent labs & imaging results that were available during my care of the patient were reviewed by me and considered in my medical decision making (see chart for details).   Ms. Sweaney is here with a fever and tachycardia and elevated white blood cell count with only complaint of small skin eruption for 24-48 hours which looks like tiny pustules, no cellulitis or abscess. There is one area on the abdomen that looks a little bit more like a vesicle and I was questioning the possibility of an attenuated chickenpox.  She not complaining of pneumonia symptoms, back pain or headache or neurologic symptoms, abdominal pain or abdominal symptoms.  I did discuss with OB/GYN doctor regarding concern for some sort of infection with left shift and febrile, we did agree upon admit the  patient for IV antibiotics and coverage for possible endometritis although skin lesions may be more likely source given the patient is not complaining of any pelvic symptoms.  I did initiate the patient on vancomycin and Zosyn.  Upon completion of pelvic exam, there is a small amount of thick vaginal discharge, wet prep and Chlamydia results are pending.  I spoke with hospitalist, Dr. Imogene Burnhen regarding hospital admission since likely source may be skin rather than OB/GYN, and the patient is not a candidate to be on the OB/GYN floor given her fever and the possibility however small of possible attenuated chickenpox.  At this point she is well appearing despite the fever and tachycardia and elevated white  blood cell count. Her lactate is normal. She's had no low blood pressures. I don't think she is septic.  Blood cultures and urine culture have been sent.   CONSULTATIONS:   Dr. Dalbert GarnetBeasley - OB/GYN, we discussed the fever and tachycardia and skin rash, and decided to go ahead and cover for possible endometritis although the patient is not complaining of bleeding or pain. Given the consideration for the possibility of an attenuated chickenpox, and the fever, patient is not a good candidate for the postpartum/OB/GYN floor and patient will be admitted to hospitalist with OB/GYN consultation.     Patient / Family / Caregiver informed of clinical course, medical decision-making process, and agree with plan.  ___________________________________________   FINAL CLINICAL IMPRESSION(S) / ED DIAGNOSES   Final diagnoses:  Fever, unspecified fever cause  Rash and nonspecific skin eruption  Endometritis              Note: This dictation was prepared with Dragon dictation. Any transcriptional errors that result from this process are unintentional    Governor RooksLord, Kaleem Sartwell, MD 05/20/17 [phone]

## 2017-05-20 NOTE — ED Triage Notes (Signed)
Pt reports that she is post partum, had vaginal delivery on 6/19. Pt was started on new birth control Sunday morning and Sunday evening began to break out with rash to trunk and legs with fever. Pt reports fever as high as 102. Pt tachycardic in triage.

## 2017-05-20 NOTE — ED Notes (Signed)
AAOx3.  Skin warm and dry.  NAD 

## 2017-05-21 ENCOUNTER — Inpatient Hospital Stay: Payer: Medicaid Other

## 2017-05-21 LAB — VARICELLA-ZOSTER BY PCR: VARICELLA-ZOSTER, PCR: NEGATIVE

## 2017-05-21 LAB — CBC
HCT: 37.5 % (ref 35.0–47.0)
HEMOGLOBIN: 12.8 g/dL (ref 12.0–16.0)
MCH: 29.3 pg (ref 26.0–34.0)
MCHC: 34.2 g/dL (ref 32.0–36.0)
MCV: 85.6 fL (ref 80.0–100.0)
Platelets: 269 10*3/uL (ref 150–440)
RBC: 4.38 MIL/uL (ref 3.80–5.20)
RDW: 13.2 % (ref 11.5–14.5)
WBC: 11.5 10*3/uL — ABNORMAL HIGH (ref 3.6–11.0)

## 2017-05-21 LAB — PROCALCITONIN

## 2017-05-21 LAB — CREATININE, SERUM: CREATININE: 0.93 mg/dL (ref 0.44–1.00)

## 2017-05-21 MED ORDER — IOPAMIDOL (ISOVUE-370) INJECTION 76%
75.0000 mL | Freq: Once | INTRAVENOUS | Status: AC | PRN
  Administered 2017-05-21: 75 mL via INTRAVENOUS

## 2017-05-21 NOTE — Progress Notes (Signed)
[employer] - [place] at [facility]                                                                                                                                                                                  Patient Demographics   Shannon Madden, is a 21 y.o. female, DOB - March 12, 1996, ZOX:[id number]  Admit date - 05/20/2017   Admitting Physician Shaune Pollack, MD  Outpatient Primary MD for the patient is [facility]   LOS - 1  Subjective:     Review of Systems:   CONSTITUTIONAL: No documented fever. No fatigue, weakness. No weight gain, no weight loss.  EYES: No blurry or double vision.  ENT: No tinnitus. No postnasal drip. No redness of the oropharynx.  RESPIRATORY: No cough, no wheeze, no hemoptysis. No dyspnea.  CARDIOVASCULAR: No chest pain. No orthopnea. No palpitations. No syncope.  GASTROINTESTINAL: No nausea, no vomiting or diarrhea. No abdominal pain. No melena or hematochezia.  GENITOURINARY: No dysuria or hematuria.  ENDOCRINE: No polyuria or nocturia. No heat or cold intolerance.  HEMATOLOGY: No anemia. No bruising. No bleeding.  INTEGUMENTARY: No rashes. No lesions.  MUSCULOSKELETAL: No arthritis. No swelling. No gout.  NEUROLOGIC: No numbness, tingling, or ataxia. No seizure-type activity.  PSYCHIATRIC: No anxiety. No insomnia. No ADD.    Vitals:   Vitals:   05/20/17 1929 05/20/17 2037 05/21/17 0543 05/21/17 1233  BP:  126/62 118/77 107/80  Pulse:  (!) 109 (!) 120 98  Resp:  20 18   Temp: 99.1 F (37.3 C) 98.6 F (37 C) 99.4 F (37.4 C) 97.9 F (36.6 C)  TempSrc: Oral Oral Oral Oral  SpO2:  97% 97% 100%  Weight:      Height:        Wt Readings from Last 3 Encounters:  05/20/17 220 lb (99.8 kg) (99 %, Z= 2.21)*  05/06/17 220 lb (99.8 kg) (99 %, Z= 2.21)*  04/24/17 220 lb 12.8 oz (100.2 kg) (99 %, Z= 2.22)*   * Growth percentiles are based on CDC 2-20 Years data.     Intake/Output Summary (Last 24 hours) at 05/21/17  1310 Last data filed at 05/21/17 0113  Gross per 24 hour  Intake              470 ml  Output                0 ml  Net              470 ml    Physical Exam:   GENERAL: Pleasant-appearing in no apparent distress.  HEAD, EYES, EARS, NOSE AND THROAT: Atraumatic, normocephalic. Extraocular muscles are intact. Pupils equal  and reactive to light. Sclerae anicteric. No conjunctival injection. No oro-pharyngeal erythema.  NECK: Supple. There is no jugular venous distention. No bruits, no lymphadenopathy, no thyromegaly.  HEART: Regular rate and rhythm,. No murmurs, no rubs, no clicks.  LUNGS: Clear to auscultation bilaterally. No rales or rhonchi. No wheezes.  ABDOMEN: Soft, flat, nontender, nondistended. Has good bowel sounds. No hepatosplenomegaly appreciated.  EXTREMITIES: No evidence of any cyanosis, clubbing, or peripheral edema.  +2 pedal and radial pulses bilaterally.  NEUROLOGIC: The patient is alert, awake, and oriented x3 with no focal motor or sensory deficits appreciated bilaterally.  SKIN: Moist and warm with no rashes appreciated.  Psych: Not anxious, depressed LN: No inguinal LN enlargement    Antibiotics   Anti-infectives    Start     Dose/Rate Route Frequency Ordered Stop   05/20/17 2200  piperacillin-tazobactam (ZOSYN) IVPB 3.375 g     3.375 g 12.5 mL/hr over 240 Minutes Intravenous Every 8 hours 05/20/17 1726     05/20/17 2200  vancomycin (VANCOCIN) IVPB 1000 mg/200 mL premix     1,000 mg 200 mL/hr over 60 Minutes Intravenous Every 8 hours 05/20/17 1726     05/20/17 1415  piperacillin-tazobactam (ZOSYN) IVPB 3.375 g     3.375 g 12.5 mL/hr over 240 Minutes Intravenous  Once 05/20/17 1405 05/20/17 1636   05/20/17 1415  vancomycin (VANCOCIN) IVPB 1000 mg/200 mL premix     1,000 mg 200 mL/hr over 60 Minutes Intravenous  Once 05/20/17 1405 05/20/17 1556      Medications   Scheduled Meds: . enoxaparin (LOVENOX) injection  40 mg Subcutaneous Q24H   Continuous  Infusions: . piperacillin-tazobactam (ZOSYN)  IV Stopped (05/21/17 0900)  . vancomycin 1,000 mg (05/21/17 0559)   PRN Meds:.acetaminophen **OR** acetaminophen, albuterol, oxyCODONE-acetaminophen   Data Review:   Micro Results Recent Results (from the past 240 hour(s))  Culture, blood (Routine x 2)     Status: None (Preliminary result)   Collection Time: 05/20/17 11:14 AM  Result Value Ref Range Status   Specimen Description BLOOD BLOOD RIGHT FOREARM  Final   Special Requests   Final    BOTTLES DRAWN AEROBIC AND ANAEROBIC Blood Culture adequate volume   Culture NO GROWTH < 24 HOURS  Final   Report Status PENDING  Incomplete  Culture, blood (Routine x 2)     Status: None (Preliminary result)   Collection Time: 05/20/17 11:14 AM  Result Value Ref Range Status   Specimen Description BLOOD BLOOD LEFT FOREARM  Final   Special Requests   Final    BOTTLES DRAWN AEROBIC AND ANAEROBIC Blood Culture adequate volume   Culture NO GROWTH < 24 HOURS  Final   Report Status PENDING  Incomplete  Chlamydia/NGC rt PCR     Status: None   Collection Time: 05/20/17  2:26 PM  Result Value Ref Range Status   Specimen source GC/Chlam ENDOCERVICAL  Final   Chlamydia Tr NOT DETECTED NOT DETECTED Final   N gonorrhoeae NOT DETECTED NOT DETECTED Final    Comment: (NOTE) 100  This methodology has not been evaluated in pregnant women or in 200  patients with a history of hysterectomy. 300 400  This methodology will not be performed on patients less than 50  years of age.   Wet prep, genital     Status: Abnormal   Collection Time: 05/20/17  2:26 PM  Result Value Ref Range Status   Yeast Wet Prep HPF POC NONE SEEN NONE SEEN Final   Trich,  Wet Prep NONE SEEN NONE SEEN Final   Clue Cells Wet Prep HPF POC NONE SEEN NONE SEEN Final   WBC, Wet Prep HPF POC MANY (A) NONE SEEN Final   Sperm NONE SEEN  Final  Aerobic Culture (superficial specimen)     Status: None (Preliminary result)   Collection Time:  05/20/17  3:14 PM  Result Value Ref Range Status   Specimen Description ABDOMEN  Final   Special Requests Normal  Final   Gram Stain   Final    RARE WBC PRESENT, PREDOMINANTLY PMN FEW GRAM POSITIVE COCCI IN CLUSTERS Performed at [facility], [address] [place], [place] [address]    Culture PENDING  Incomplete   Report Status PENDING  Incomplete    Radiology Reports Dg Chest 2 View  Result Date: 05/20/2017 CLINICAL DATA:  Fever EXAM: CHEST  2 VIEW COMPARISON:  None. FINDINGS: Lungs are clear. Heart size and pulmonary vascularity are normal. No adenopathy. No bone lesions. IMPRESSION: No edema or consolidation. Electronically Signed   By: Bretta Bang III M.D.   On: 05/20/2017 13:11   Ct Angio Chest Pe W Or Wo Contrast  Result Date: 05/21/2017 CLINICAL DATA:  Two weeks postpartum with fever. Rash over the lower legs. EXAM: CT ANGIOGRAPHY CHEST WITH CONTRAST TECHNIQUE: Multidetector CT imaging of the chest was performed using the standard protocol during bolus administration of intravenous contrast. Multiplanar CT image reconstructions and MIPs were obtained to evaluate the vascular anatomy. CONTRAST:  75 mL Isovue 370 IV. COMPARISON:  Chest x-ray 05/20/2017 FINDINGS: Cardiovascular: Heart is normal in size. No evidence of pulmonary embolism. Thoracic aorta is within normal. Remaining vascular structures are normal. Mediastinum/Nodes: No mediastinal or hilar adenopathy. Remaining mediastinal structures are within normal. Lungs/Pleura: Lungs are clear.  Airways are normal. Upper Abdomen: Within normal. Musculoskeletal: Within normal. Review of the MIP images confirms the above findings. IMPRESSION: No evidence of pulmonary embolism. No acute cardiopulmonary disease. Electronically Signed   By: Elberta Fortis M.D.   On: 05/21/2017 10:24   [place] Mfm Ob Follow Up  Result Date: 04/24/2017 ----------------------------------------------------------------------  OBSTETRICS REPORT                       (Signed Final 04/24/2017 10:00 am) ---------------------------------------------------------------------- PATIENT INFO:  ID #:       [id number]                          D.O.B.:  [birth year] (20 yrs)  Name:       ZANYIA SILBAUGH                      Visit Date: 04/24/2017 09:43 am              NISEWONDER ---------------------------------------------------------------------- PERFORMED BY:  Performed By:     Baron Hamper Hickernell      Ref. Address:      [address] [place],  [place] [address]  Referred By:      Milon Score CNM ---------------------------------------------------------------------- SERVICE(S) PROVIDED:   [place] MFM OB FOLLOW UP                                   [phone]  ---------------------------------------------------------------------- INDICATIONS:   [redacted] weeks gestation of pregnancy                Z3A.34  ---------------------------------------------------------------------- FETAL EVALUATION:  Num Of Fetuses:     1  Fetal Heart         143  Rate(bpm):  Presentation:       Cephalic  Placenta:           Anterior, No previa  Amniotic Fluid  AFI FV:      Within normal limits  AFI Sum(cm)     %Tile  15.9            57 ---------------------------------------------------------------------- BIOMETRY:  BPD:      88.9  mm     G. Age:  36w 0d         87  %    CI:        75.76   %    70 - 86                                                          FL/HC:       20.4  %    19.4 - 21.8  HC:      323.8  mm     G. Age:  36w 5d         71  %    HC/AC:       1.03       0.96 - 1.11  AC:      313.1  mm     G. Age:  35w 1d         77  %    FL/BPD:      74.1  %    71 - 87  FL:       65.9  mm     G. Age:  34w 0d         29  %    FL/AC:       21.0  %    20 - 24  HUM:      56.6  mm     G. Age:  32w 6d         29  %  Est. FW:    2596   gm   5 lb 12 oz      62  %  ---------------------------------------------------------------------- GESTATIONAL AGE:  LMP:           37w 6d        Date:  08/02/16                 EDD:   05/09/17  U/S Today:     35w 3d                                        EDD:   05/26/17  Best:          34w 3d     Det. ByMarcella Dubs         EDD:   06/02/17                                      (11/08/16) ---------------------------------------------------------------------- ANATOMY:  Cavum:                 Normal appearance      Ductal Arch:            Visualized                                                                        previously  Ventricles:            Normal appearance      Diaphragm:              Within Normal Limits  Cerebellum:            Visualized             Stomach:                Seen                         previously  Posterior Fossa:       Visualized             Abdomen:                Within Normal Limits                         previously  Nuchal Fold:           Beyond 22 weeks        Abdominal Wall:         Visualized                         gestation                                      previously  Face:                  Orbits visualized      Cord Vessels:           3 vessels,                         previously                                     visualized previously  Lips:                  Normal appearance      Kidneys:                Rt. WNL, Lt.  Mild                                                                        Pylectasis = 7.7 mm  Thoracic:              Within Normal Limits   Bladder:                Seen  Heart:                 4-Chamber view         Spine:                  Normal appearance                         appears normal  RVOT:                  Normal appearance      Upper Extremities:      Visualized                                                                        previously  LVOT:                  Seen on prior          Lower Extremities:      Visualized                                                                         previously  Aortic Arch:           Normal appearance ---------------------------------------------------------------------- IMPRESSION:  Dear Dr. Yetta Barre,  Thank you for referring your patient to [facility] for  follow-up exam of pyelectasis.  There is a singleton gestation with normal amniotic fluid  volume.  The fetal biometry correlates with established dating.  Adequate interval growth noted.  The estimated fetal weight is at the 62   percentile. AGA.  Renal pyelectasis is again noted on the ultrasound today.  The left renal pelvis measures    7.7   mm.  The right renal pelvis is normal.  It was stressed to the patient once again that a renal pelvic  diameter of 7.7 mm is associated with a low likelihood of  obstructive uropathy requiring correction (10%), while the risk  increases to 70% when the APD is greater than 20mm.  Neonatal ultrasound assessment of the kidneys is  recommended given the finding of dilated renal pelvis at this  gestational age.  Thank you for allowing [place] to participate in your patient's care.  Please do not hesistate to contact us if we can  be of further  assistance.  Artemio AlyBrenna Hughes MD ----------------------------------------------------------------------                 Artemio AlyBrenna Hughes, MD Electronically Signed Final Report   04/24/2017 10:00 am ----------------------------------------------------------------------    CBC  Recent Labs Lab 05/20/17 1114 05/21/17 0652  WBC 14.3* 11.5*  HGB 13.3 12.8  HCT 39.0 37.5  PLT 291 269  MCV 86.1 85.6  MCH 29.3 29.3  MCHC 34.1 34.2  RDW 13.4 13.2  LYMPHSABS 0.8*  --   MONOABS 0.6  --   EOSABS 0.0  --   BASOSABS 0.0  --     Chemistries   Recent Labs Lab 05/20/17 1114 05/21/17 0652  NA 137  --   K 3.5  --   CL 105  --   CO2 23  --   GLUCOSE 106*  --   BUN 10  --   CREATININE 0.89 0.93  CALCIUM 9.1  --   AST 23  --   ALT 18  --   ALKPHOS 118  --   BILITOT 0.8  --     ------------------------------------------------------------------------------------------------------------------ estimated creatinine clearance is 116 mL/min (by C-G formula based on SCr of 0.93 mg/dL). ------------------------------------------------------------------------------------------------------------------ No results for input(s): HGBA1C in the last 72 hours. ------------------------------------------------------------------------------------------------------------------ No results for input(s): CHOL, HDL, LDLCALC, TRIG, CHOLHDL, LDLDIRECT in the last 72 hours. ------------------------------------------------------------------------------------------------------------------ No results for input(s): TSH, T4TOTAL, T3FREE, THYROIDAB in the last 72 hours.  Invalid input(s): FREET3 ------------------------------------------------------------------------------------------------------------------ No results for input(s): VITAMINB12, FOLATE, FERRITIN, TIBC, IRON, RETICCTPCT in the last 72 hours.  Coagulation profile  Recent Labs Lab 05/20/17 1114  INR 1.05    No results for input(s): DDIMER in the last 72 hours.  Cardiac Enzymes No results for input(s): CKMB, TROPONINI, MYOGLOBIN in the last 168 hours.  Invalid input(s): CK ------------------------------------------------------------------------------------------------------------------ Invalid input(s): POCBNP    Assessment & Plan   Patient is a 21 year old with fever and tachycardia  1. Fever and tachycardia possibly due to cellulitis felt to be due to MRSA folliculitis Cultures are currently pending from the blood and wound Continue IV antibiotics  2. Sinus tachycardia and a pregnant patient I'll pain the CT of the chest which was negative for pulmonary embolism continue supportive care for now     Code Status Orders        Start     Ordered   05/20/17 1634  Full code  Continuous     05/20/17 1633     Code Status History    Date Active Date Inactive Code Status Order ID Comments User Context   05/06/2017  4:39 PM 05/08/2017  3:30 PM Full Code [id number]  Ward, Elenora Fenderhelsea C, MD Inpatient   05/06/2017  6:20 AM 05/06/2017  4:39 PM Full Code [id number]  Rosalee KaufmanSpielman, Rebecca H, RN Inpatient   05/05/2017  7:13 PM 05/05/2017 11:35 PM Full Code [id number]  Sharee PimpleJones, Caron W, CNM Inpatient           Consults Infectious disease   DVT Prophylaxis  Lovenox  Lab Results  Component Value Date   PLT 269 05/21/2017     Time Spent in minutes  35 minutes  Greater than 50% of time spent in care coordination and counseling patient regarding the condition and plan of care.   Auburn BilberryPATEL, Kalven Ganim M.D on 05/21/2017 at 1:10 PM  Between 7am to 6pm - Pager - [phone]  After 6pm go to [email or web address] - password [email or web address] [facility]  [facility]   Office  [phone]

## 2017-05-21 NOTE — Progress Notes (Signed)
ID enote Fevers improving. Still tachycardic. CT done of chest and neg for PE.   Gram stain of lesion with GPC in clusters.   Most likely MRSA folliculitis.  Would continue vanco while inpatient and pending culture results.  IF improving and wishes to be dced and blood  cultures negative I would suggest obtaining MRSA culture result [facility] and reviewing sensitivities and dcing on appropriate abx for 10 day course.

## 2017-05-22 LAB — HIV ANTIBODY (ROUTINE TESTING W REFLEX): HIV Screen 4th Generation wRfx: NONREACTIVE

## 2017-05-22 LAB — CBC
HEMATOCRIT: 38.9 % (ref 35.0–47.0)
HEMOGLOBIN: 13.3 g/dL (ref 12.0–16.0)
MCH: 29.2 pg (ref 26.0–34.0)
MCHC: 34 g/dL (ref 32.0–36.0)
MCV: 85.6 fL (ref 80.0–100.0)
Platelets: 246 10*3/uL (ref 150–440)
RBC: 4.55 MIL/uL (ref 3.80–5.20)
RDW: 13.5 % (ref 11.5–14.5)
WBC: 7 10*3/uL (ref 3.6–11.0)

## 2017-05-22 LAB — BASIC METABOLIC PANEL
ANION GAP: 10 (ref 5–15)
BUN: 8 mg/dL (ref 6–20)
CHLORIDE: 105 mmol/L (ref 101–111)
CO2: 26 mmol/L (ref 22–32)
Calcium: 9.1 mg/dL (ref 8.9–10.3)
Creatinine, Ser: 0.87 mg/dL (ref 0.44–1.00)
GFR calc non Af Amer: >60 mL/min (ref >60)
Glucose, Bld: 93 mg/dL (ref 65–99)
POTASSIUM: 3.3 mmol/L — AB (ref 3.5–5.1)
SODIUM: 141 mmol/L (ref 135–145)

## 2017-05-22 LAB — VANCOMYCIN, TROUGH: VANCOMYCIN TR: 17 ug/mL (ref 15–20)

## 2017-05-22 LAB — URINE CULTURE: Culture: <10000 — AB

## 2017-05-22 MED ORDER — MUPIROCIN 2 % EX OINT
TOPICAL_OINTMENT | Freq: Two times a day (BID) | CUTANEOUS | Status: DC
  Filled 2017-05-22: qty 22

## 2017-05-22 MED ORDER — MUPIROCIN 2 % EX OINT: TOPICAL_OINTMENT | CUTANEOUS | 0 refills | Status: DC

## 2017-05-22 MED ORDER — CLINDAMYCIN HCL 300 MG PO CAPS: 300.0000 mg | ORAL_CAPSULE | Freq: Three times a day (TID) | ORAL | 0 refills | Status: AC

## 2017-05-22 MED ORDER — DOCUSATE SODIUM 100 MG PO CAPS
100.0000 mg | ORAL_CAPSULE | Freq: Two times a day (BID) | ORAL | Status: DC
  Administered 2017-05-22: 100 mg via ORAL
  Filled 2017-05-22: qty 1

## 2017-05-22 MED ORDER — BISACODYL 5 MG PO TBEC
5.0000 mg | DELAYED_RELEASE_TABLET | Freq: Every day | ORAL | Status: DC | PRN
  Administered 2017-05-22: 5 mg via ORAL
  Filled 2017-05-22: qty 1

## 2017-05-22 MED ORDER — MUPIROCIN 2 % EX OINT: TOPICAL_OINTMENT | Freq: Two times a day (BID) | CUTANEOUS | 0 refills | Status: AC

## 2017-05-22 NOTE — Discharge Instructions (Signed)
call if new lesions occur or if lesions recur after dc. bactroban nasal ointment bid for 7 days a time

## 2017-05-22 NOTE — Progress Notes (Signed)
Varicella zoster negative.  Dr Imogene Burnchen gave order to discontinue airborne precautions but to continue contact due to son recently diagnosed with MRSA.  [facility] in [facility] chart

## 2017-05-22 NOTE — Progress Notes (Signed)
Results of MRSA sensitivities for patient's son are in the patients chart

## 2017-05-22 NOTE — Discharge Summary (Signed)
[facility] [place] [facility]   PATIENT NAME: Shannon Madden    MR#:  [id number]  DATE OF BIRTH:  October 14, 1996  DATE OF ADMISSION:  05/20/2017   ADMITTING PHYSICIAN: Shaune Pollack, MD  DATE OF DISCHARGE: 05/22/2017 PRIMARY CARE PHYSICIAN: [facility]   ADMISSION DIAGNOSIS:  Endometritis [N71.9] Rash and nonspecific skin eruption [R21] Fever, unspecified fever cause [R50.9] DISCHARGE DIAGNOSIS:  Active Problems:   Sepsis (HCC)  SECONDARY DIAGNOSIS:   Past Medical History:  Diagnosis Date  . Medical history non-contributory    HOSPITAL COURSE:  Patient is a 21 year old with fever and tachycardia  1. Fever and tachycardia due to MRSA folliculitis Blood culture is negative so far, wound culture show gram-positive cocci in clusters. The patient has been treated with vancomycin and Zosyn IV. Her baby also had MRSA infection. Change to clindamycin 300 mg 3 times a day for 5 more days per Dr. Sampson Goon. Bactrim ointment nasal twice a day for 7 days.  2. Sinus tachycardia and a pregnant, improved. CT of the chest which was negative for pulmonary embolism.  Hypokalemia. Given potassium supplement.  DISCHARGE CONDITIONS:  Stable, discharge to home today. CONSULTS OBTAINED:  Treatment Team:  Mick Sell, MD DRUG ALLERGIES:   Allergies  Allergen Reactions  . Coconut Oil Anaphylaxis  . Fish Allergy Anaphylaxis   DISCHARGE MEDICATIONS:   Allergies as of 05/22/2017      Reactions   Coconut Oil Anaphylaxis   Fish Allergy Anaphylaxis      Medication List    STOP taking these medications   ibuprofen 600 MG tablet Commonly known as:  ADVIL,MOTRIN     TAKE these medications   benzocaine-Menthol 20-0.5 % Aero Commonly known as:  DERMOPLAST Apply 1 application topically as needed for irritation (perineal discomfort).   clindamycin 300 MG capsule Commonly known as:  CLEOCIN Take 1 capsule (300 mg total) by mouth 3 (three) times  daily.   multivitamin-prenatal 27-0.8 MG Tabs tablet Take 1 tablet by mouth daily at 12 noon.   mupirocin ointment 2 % Commonly known as:  BACTROBAN Place into the nose 2 (two) times daily.   norethindrone 0.35 MG tablet Commonly known as:  ORTHO MICRONOR Take 1 tablet (0.35 mg total) by mouth daily.        DISCHARGE INSTRUCTIONS:  See AVS.  If you experience worsening of your admission symptoms, develop shortness of breath, life threatening emergency, suicidal or homicidal thoughts you must seek medical attention immediately by calling 911 or calling your MD immediately  if symptoms less severe.  You Must read complete instructions/literature along with all the possible adverse reactions/side effects for all the Medicines you take and that have been prescribed to you. Take any new Medicines after you have completely understood and accpet all the possible adverse reactions/side effects.   Please note  You were cared for by a hospitalist during your hospital stay. If you have any questions about your discharge medications or the care you received while you were in the hospital after you are discharged, you can call the unit and asked to speak with the hospitalist on call if the hospitalist that took care of you is not available. Once you are discharged, your primary care physician will handle any further medical issues. Please note that NO REFILLS for any discharge medications will be authorized once you are discharged, as it is imperative that you return to your primary care physician (or establish a relationship with a primary care physician  if you do not have one) for your aftercare needs so that they can reassess your need for medications and monitor your lab values.    On the day of Discharge:  VITAL SIGNS:  Blood pressure 110/78, pulse 88, temperature 98.1 F (36.7 C), temperature source Oral, resp. rate 20, height 5\' 6"  (1.676 m), weight 220 lb (99.8 kg), SpO2 99 %, unknown if  currently breastfeeding. PHYSICAL EXAMINATION:  GENERAL:  21 y.o.-year-old patient lying in the bed with no acute distress.  EYES: Pupils equal, round, reactive to light and accommodation. No scleral icterus. Extraocular muscles intact.  HEENT: Head atraumatic, normocephalic. Oropharynx and nasopharynx clear.  NECK:  Supple, no jugular venous distention. No thyroid enlargement, no tenderness.  LUNGS: Normal breath sounds bilaterally, no wheezing, rales,rhonchi or crepitation. No use of accessory muscles of respiration.  CARDIOVASCULAR: S1, S2 normal. No murmurs, rubs, or gallops.  ABDOMEN: Soft, non-tender, non-distended. Bowel sounds present. No organomegaly or mass.  EXTREMITIES: No pedal edema, cyanosis, or clubbing.  NEUROLOGIC: Cranial nerves II through XII are intact. Muscle strength 5/5 in all extremities. Sensation intact. Gait not checked.  PSYCHIATRIC: The patient is alert and oriented x 3.  SKIN: No obvious rash, lesion, or ulcer. several bumps in abdomen and both legs DATA REVIEW:   CBC  Recent Labs Lab 05/22/17 0616  WBC 7.0  HGB 13.3  HCT 38.9  PLT 246    Chemistries   Recent Labs Lab 05/20/17 1114  05/22/17 0616  NA 137  --  141  K 3.5  --  3.3*  CL 105  --  105  CO2 23  --  26  GLUCOSE 106*  --  93  BUN 10  --  8  CREATININE 0.89  < > 0.87  CALCIUM 9.1  --  9.1  AST 23  --   --   ALT 18  --   --   ALKPHOS 118  --   --   BILITOT 0.8  --   --   < > = values in this interval not displayed.   Microbiology Results  Results for orders placed or performed during the hospital encounter of 05/20/17  Culture, blood (Routine x 2)     Status: None (Preliminary result)   Collection Time: 05/20/17 11:14 AM  Result Value Ref Range Status   Specimen Description BLOOD BLOOD RIGHT FOREARM  Final   Special Requests   Final    BOTTLES DRAWN AEROBIC AND ANAEROBIC Blood Culture adequate volume   Culture NO GROWTH 2 DAYS  Final   Report Status PENDING  Incomplete    Culture, blood (Routine x 2)     Status: None (Preliminary result)   Collection Time: 05/20/17 11:14 AM  Result Value Ref Range Status   Specimen Description BLOOD BLOOD LEFT FOREARM  Final   Special Requests   Final    BOTTLES DRAWN AEROBIC AND ANAEROBIC Blood Culture adequate volume   Culture NO GROWTH 2 DAYS  Final   Report Status PENDING  Incomplete  Urine culture     Status: Abnormal   Collection Time: 05/20/17 11:14 AM  Result Value Ref Range Status   Specimen Description URINE, RANDOM  Final   Special Requests NONE  Final   Culture (A)  Final    <10,000 COLONIES/mL INSIGNIFICANT GROWTH Performed at [facility], [address], [place], [place] [address]    Report Status 05/22/2017 FINAL  Final  Chlamydia/NGC rt PCR     Status:  None   Collection Time: 05/20/17  2:26 PM  Result Value Ref Range Status   Specimen source GC/Chlam ENDOCERVICAL  Final   Chlamydia Tr NOT DETECTED NOT DETECTED Final   N gonorrhoeae NOT DETECTED NOT DETECTED Final    Comment: (NOTE) 100  This methodology has not been evaluated in pregnant women or in 200  patients with a history of hysterectomy. 300 400  This methodology will not be performed on patients less than 2014  years of age.   Wet prep, genital     Status: Abnormal   Collection Time: 05/20/17  2:26 PM  Result Value Ref Range Status   Yeast Wet Prep HPF POC NONE SEEN NONE SEEN Final   Trich, Wet Prep NONE SEEN NONE SEEN Final   Clue Cells Wet Prep HPF POC NONE SEEN NONE SEEN Final   WBC, Wet Prep HPF POC MANY (A) NONE SEEN Final   Sperm NONE SEEN  Final  Aerobic Culture (superficial specimen)     Status: None (Preliminary result)   Collection Time: 05/20/17  3:14 PM  Result Value Ref Range Status   Specimen Description ABDOMEN  Final   Special Requests Normal  Final   Gram Stain   Final    RARE WBC PRESENT, PREDOMINANTLY PMN FEW GRAM POSITIVE COCCI IN CLUSTERS Performed at [facility], [address]  [place], [place] [address]    Culture PENDING  Incomplete   Report Status PENDING  Incomplete    RADIOLOGY:  No results found.   Management plans discussed with the patient, Her husband and they are in agreement.  CODE STATUS: Full Code   TOTAL TIME TAKING CARE OF THIS PATIENT: 33 minutes.    Shaune Pollackhen, Janaa Acero M.D on 05/22/2017 at 2:52 PM  Between 7am to 6pm - Pager - [phone]  After 6pm go to [email or web address] - Social research officer, governmentpassword [email or web address]  [facility] [place] [facility]  Office  [phone]  CC: Primary care physician; [facility]   Note: This dictation was prepared with Dragon dictation along with smaller phrase technology. Any transcriptional errors that result from this process are unintentional.

## 2017-05-22 NOTE — Progress Notes (Signed)
Pharmacy Antibiotic Note  Shannon PufferSamantha M Madden is a 21 y.o. female admitted on 05/20/2017 with possible skin infection.  Pharmacy has been consulted for vancomycin and Zosyn dosing. Zosyn x1 at 1436 and vanc x1 at 1438 in the ED.   Plan: Vancomycin 1000 mg IV every 8 hours.  Goal trough 15-20 mcg/mL. Zosyn 3.375g IV q8h (4 hour infusion).   Follow up on renal function in AM- SCr ordered for AM. If SCr increases (i.e. renal function worsens, may need to decrease dose).  Vanc trough before 5th overall dose.   7/5 @ 0600 VT 17 drawn appropriately. Will recheck next VT 7/6 @ 0600 to ensure therapeutic trough.  Height: 5\' 6"  (167.6 cm) Weight: 220 lb (99.8 kg) IBW/kg (Calculated) : 59.3  Temp (24hrs), Avg:98.2 F (36.8 C), Min:97.9 F (36.6 C), Max:98.5 F (36.9 C)   Recent Labs Lab 05/20/17 1114 05/21/17 0652 05/22/17 0616  WBC 14.3* 11.5* 7.0  CREATININE 0.89 0.93  --   LATICACIDVEN 1.1  --   --   VANCOTROUGH  --   --  17    Estimated Creatinine Clearance: 116 mL/min (by C-G formula based on SCr of 0.93 mg/dL).    Allergies  Allergen Reactions  . Coconut Oil Anaphylaxis  . Fish Allergy Anaphylaxis    Antimicrobials this admission: Vancomycin/Zosyn 7/3 >>  Dose adjustments this admission:   Microbiology results: 7/3 BCx: sent 7/3 UCx: sent    Thank you for allowing pharmacy to be a part of this patient's care.  Thomasene Rippleavid Kevontay Burks, PharmD, BCPS Clinical Pharmacist 05/22/2017

## 2017-05-22 NOTE — Progress Notes (Signed)
No BM since 05/18/17. Orders received for dulcolax and colace

## 2017-05-22 NOTE — Progress Notes (Signed)
Patient tearful about situation and not getting to be with her newborn son.  I spoke with Dr Sampson GoonFitzgerald and he said it would be okay for the infant son to be in the room with the patient.  Since he already  Has MRSA and is on treatment

## 2017-05-22 NOTE — Progress Notes (Signed)
[facility] CLINIC INFECTIOUS DISEASE PROGRESS NOTE Date of Admission:  05/20/2017     ID: Shannon Madden is a 21 y.o. female with folliculitis Active Problems:   Sepsis (HCC)   Subjective: Feels much better. No fevers. No new lesions. Some back aching but better.   ROS  Eleven systems are reviewed and negative except per hpi  Medications:  Antibiotics Given (last 72 hours)    Date/Time Action Medication Dose Rate   05/20/17 1436 New Bag/Given   piperacillin-tazobactam (ZOSYN) IVPB 3.375 g 3.375 g 12.5 mL/hr   05/20/17 1438 New Bag/Given   vancomycin (VANCOCIN) IVPB 1000 mg/200 mL premix 1,000 mg 200 mL/hr   05/20/17 2213 New Bag/Given   piperacillin-tazobactam (ZOSYN) IVPB 3.375 g 3.375 g 12.5 mL/hr   05/20/17 2213 New Bag/Given   vancomycin (VANCOCIN) IVPB 1000 mg/200 mL premix 1,000 mg 200 mL/hr   05/21/17 0559 New Bag/Given   piperacillin-tazobactam (ZOSYN) IVPB 3.375 g 3.375 g 12.5 mL/hr   05/21/17 0559 New Bag/Given   vancomycin (VANCOCIN) IVPB 1000 mg/200 mL premix 1,000 mg 200 mL/hr   05/21/17 1420 New Bag/Given   piperacillin-tazobactam (ZOSYN) IVPB 3.375 g 3.375 g 12.5 mL/hr   05/21/17 1420 New Bag/Given   vancomycin (VANCOCIN) IVPB 1000 mg/200 mL premix 1,000 mg 200 mL/hr   05/21/17 2245 New Bag/Given   vancomycin (VANCOCIN) IVPB 1000 mg/200 mL premix 1,000 mg 200 mL/hr   05/22/17 0010 New Bag/Given  [Vanc running]   piperacillin-tazobactam (ZOSYN) IVPB 3.375 g 3.375 g 12.5 mL/hr   05/22/17 4098 New Bag/Given   piperacillin-tazobactam (ZOSYN) IVPB 3.375 g 3.375 g 12.5 mL/hr   05/22/17 0713 New Bag/Given  [waiting for trough results]   vancomycin (VANCOCIN) IVPB 1000 mg/200 mL premix 1,000 mg 200 mL/hr     . enoxaparin (LOVENOX) injection  40 mg Subcutaneous Q24H    Objective: Vital signs in last 24 hours: Temp:  [98.1 F (36.7 C)-98.5 F (36.9 C)] 98.1 F (36.7 C) (07/05 1257) Pulse Rate:  [83-88] 88 (07/05 1257) Resp:  [20] 20 (07/05 1257) BP:  (104-112)/(61-78) 110/78 (07/05 1257) SpO2:  [99 %] 99 % (07/05 1257) Constitutional:  oriented to person, place, and time. appears well-developed and well-nourished. No distress.  HENT: [place]/AT, PERRLA, no scleral icterus  Mouth/Throat: Oropharynx is clear and moist. No oral lesions No oropharyngeal exudate.  Cardiovascular: Normal rate, regular rhythm and normal heart sounds. Exam reveals no gallop and no friction rub.  No murmur heard.  Pulmonary/Chest: Effort normal and breath sounds normal. No respiratory distress.  has no wheezes.  Neck = supple, no nuchal rigidity Abdominal: Soft. Bowel sounds are normal.  exhibits no distension. There is no tenderness.  Lymphadenopathy: no cervical adenopathy. No axillary adenopathy Neurological: alert and oriented to person, place, and time.  Skin: abd with several raised lesions with white fluid. Psychiatric: a normal mood and affect.  behavior is normal.    Lab Results  Recent Labs  05/20/17 1114 05/21/17 0652 05/22/17 0616  WBC 14.3* 11.5* 7.0  HGB 13.3 12.8 13.3  HCT 39.0 37.5 38.9  NA 137  --  141  K 3.5  --  3.3*  CL 105  --  105  CO2 23  --  26  BUN 10  --  8  CREATININE 0.89 0.93 0.87    Microbiology: Results for orders placed or performed during the hospital encounter of 05/20/17  Culture, blood (Routine x 2)     Status: None (Preliminary result)   Collection Time: 05/20/17  11:14 AM  Result Value Ref Range Status   Specimen Description BLOOD BLOOD RIGHT FOREARM  Final   Special Requests   Final    BOTTLES DRAWN AEROBIC AND ANAEROBIC Blood Culture adequate volume   Culture NO GROWTH 2 DAYS  Final   Report Status PENDING  Incomplete  Culture, blood (Routine x 2)     Status: None (Preliminary result)   Collection Time: 05/20/17 11:14 AM  Result Value Ref Range Status   Specimen Description BLOOD BLOOD LEFT FOREARM  Final   Special Requests   Final    BOTTLES DRAWN AEROBIC AND ANAEROBIC Blood Culture adequate volume    Culture NO GROWTH 2 DAYS  Final   Report Status PENDING  Incomplete  Urine culture     Status: Abnormal   Collection Time: 05/20/17 11:14 AM  Result Value Ref Range Status   Specimen Description URINE, RANDOM  Final   Special Requests NONE  Final   Culture (A)  Final    <10,000 COLONIES/mL INSIGNIFICANT GROWTH Performed at [facility], [address], [place], [place] [address]    Report Status 05/22/2017 FINAL  Final  Chlamydia/NGC rt PCR     Status: None   Collection Time: 05/20/17  2:26 PM  Result Value Ref Range Status   Specimen source GC/Chlam ENDOCERVICAL  Final   Chlamydia Tr NOT DETECTED NOT DETECTED Final   N gonorrhoeae NOT DETECTED NOT DETECTED Final    Comment: (NOTE) 100  This methodology has not been evaluated in pregnant women or in 200  patients with a history of hysterectomy. 300 400  This methodology will not be performed on patients less than 64  years of age.   Wet prep, genital     Status: Abnormal   Collection Time: 05/20/17  2:26 PM  Result Value Ref Range Status   Yeast Wet Prep HPF POC NONE SEEN NONE SEEN Final   Trich, Wet Prep NONE SEEN NONE SEEN Final   Clue Cells Wet Prep HPF POC NONE SEEN NONE SEEN Final   WBC, Wet Prep HPF POC MANY (A) NONE SEEN Final   Sperm NONE SEEN  Final  Aerobic Culture (superficial specimen)     Status: None (Preliminary result)   Collection Time: 05/20/17  3:14 PM  Result Value Ref Range Status   Specimen Description ABDOMEN  Final   Special Requests Normal  Final   Gram Stain   Final    RARE WBC PRESENT, PREDOMINANTLY PMN FEW GRAM POSITIVE COCCI IN CLUSTERS Performed at [facility], [address], [place], [place] [address]    Culture PENDING  Incomplete   Report Status PENDING  Incomplete    Studies/Results: Ct Angio Chest Pe W Or Wo Contrast  Result Date: 05/21/2017 CLINICAL DATA:  Two weeks postpartum with fever. Rash over the lower legs. EXAM: CT ANGIOGRAPHY CHEST WITH CONTRAST TECHNIQUE:  Multidetector CT imaging of the chest was performed using the standard protocol during bolus administration of intravenous contrast. Multiplanar CT image reconstructions and MIPs were obtained to evaluate the vascular anatomy. CONTRAST:  75 mL Isovue 370 IV. COMPARISON:  Chest x-ray 05/20/2017 FINDINGS: Cardiovascular: Heart is normal in size. No evidence of pulmonary embolism. Thoracic aorta is within normal. Remaining vascular structures are normal. Mediastinum/Nodes: No mediastinal or hilar adenopathy. Remaining mediastinal structures are within normal. Lungs/Pleura: Lungs are clear.  Airways are normal. Upper Abdomen: Within normal. Musculoskeletal: Within normal. Review of the MIP images confirms the above findings. IMPRESSION: No  evidence of pulmonary embolism. No acute cardiopulmonary disease. Electronically Signed   By: Elberta Fortisaniel  Boyle M.D.   On: 05/21/2017 10:24    Assessment/Plan: Shannon Madden is a 21 y.o. female recently postpartum admitted with skin lesions and fever to 101, wbc 14.  Her son had MRSA infection recently as [facility] and just dced on clindamycin.  Her lesions are consistent with Staph folliculitis -Gram stain shows GPC in clusters, cx pending but I suspect will be same MRSA strain as son.  VZV PCR neg She is much improved.  Recommendations Can dc on oral clinda 300 mg tid for 5 more days Discussed warm compresses to sites to get them to drain  Son is on clinda as well so no issues with breastfeeding.  She should uses hibiclens as well as her husband - I spoke with them regarding this at admission. She will call if new lesions occur or if lesions recur after dc. Would give rx for bactroban nasal ointment bid for 7 days a time of dc.   Thank you very much for the consult. Will follow with you.  Keshav Winegar P   05/22/2017, 1:42 PM

## 2017-05-23 LAB — AEROBIC CULTURE W GRAM STAIN (SUPERFICIAL SPECIMEN)

## 2017-05-23 LAB — AEROBIC CULTURE  (SUPERFICIAL SPECIMEN): SPECIAL REQUESTS: NORMAL

## 2017-05-25 LAB — CULTURE, BLOOD (ROUTINE X 2)
Culture: NO GROWTH
Culture: NO GROWTH
SPECIAL REQUESTS: ADEQUATE
SPECIAL REQUESTS: ADEQUATE

## 2017-11-14 IMAGING — US US OB COMP LESS 14 WK
1 series · 14 of 28 positions shown · non-contrast
Comparison: Pelvic ultrasound 02/08/2016.

CLINICAL DATA: Vaginal bleeding.  Beta HCG of [DATE].

EXAM:
OBSTETRIC <14 WK ULTRASOUND
TECHNIQUE: Transabdominal ultrasound was performed for evaluation of the
gestation as well as the maternal uterus and adnexal regions.

[Series 1: us ob comp less 14 wk · 0.15mm/px · 14 of 44 slices shown]
[im 2/44]
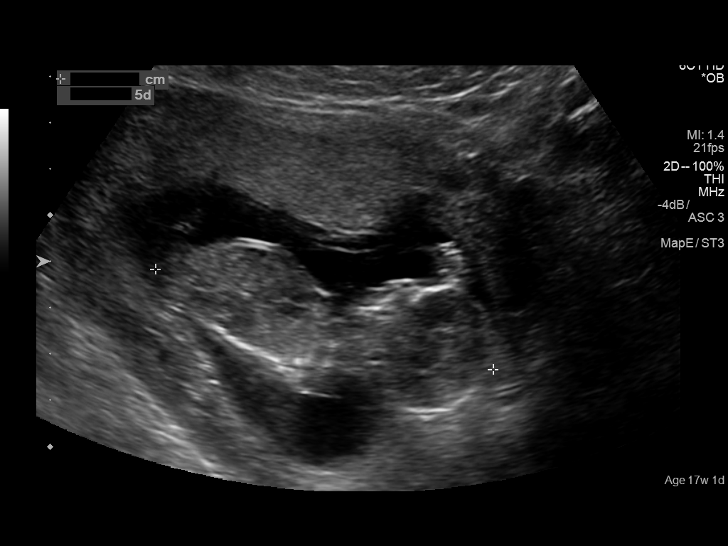
[im 5/44]
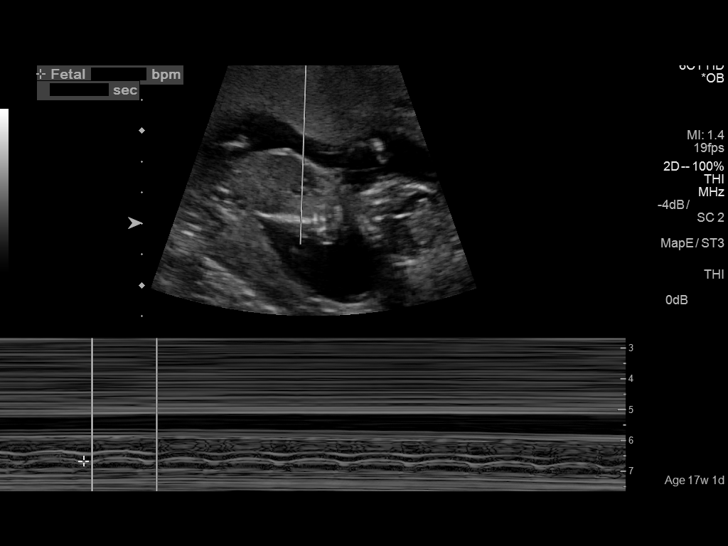
[im 8/44]
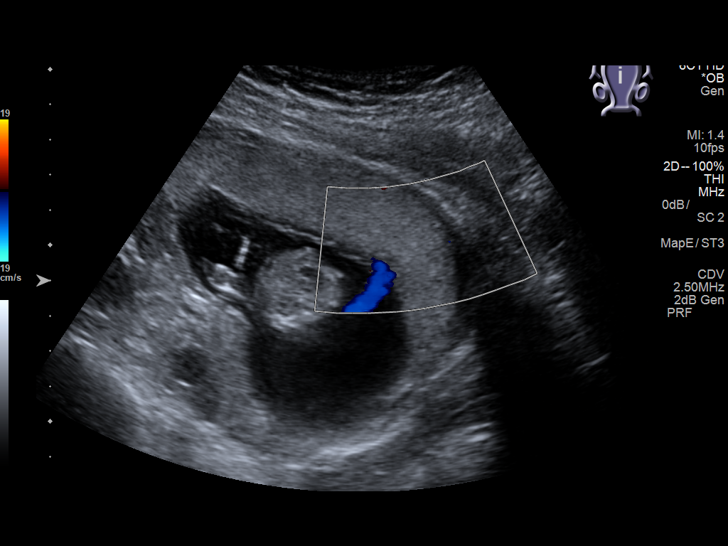
[im 12/44]
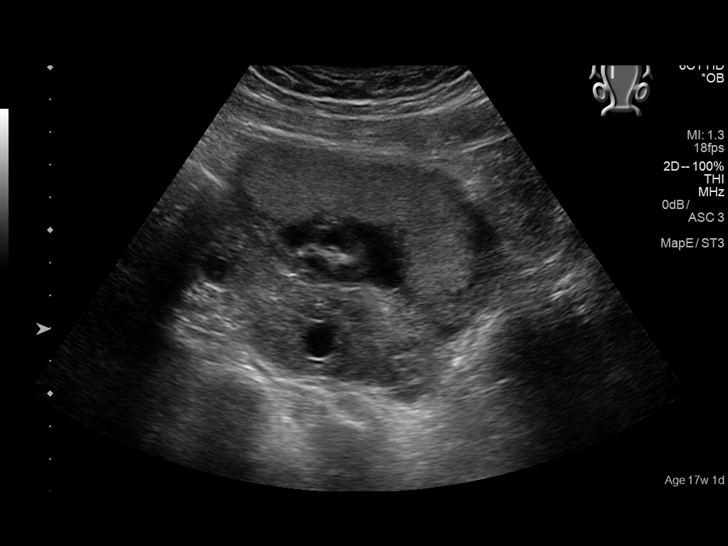
[im 15/44]
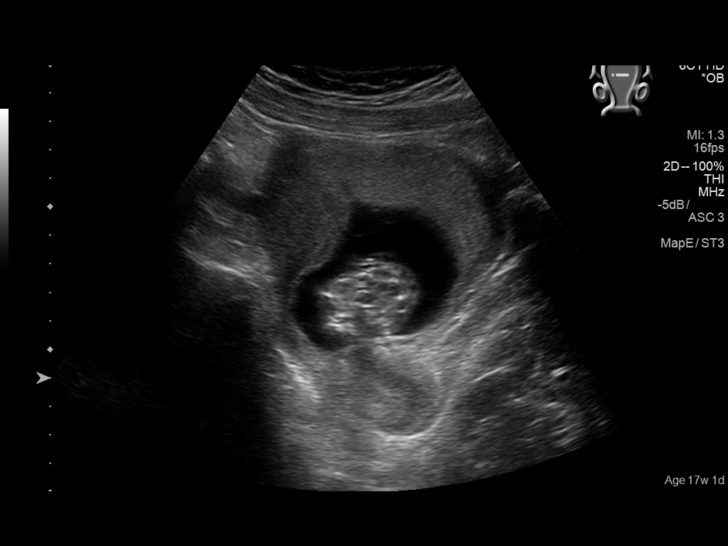
[im 18/44]
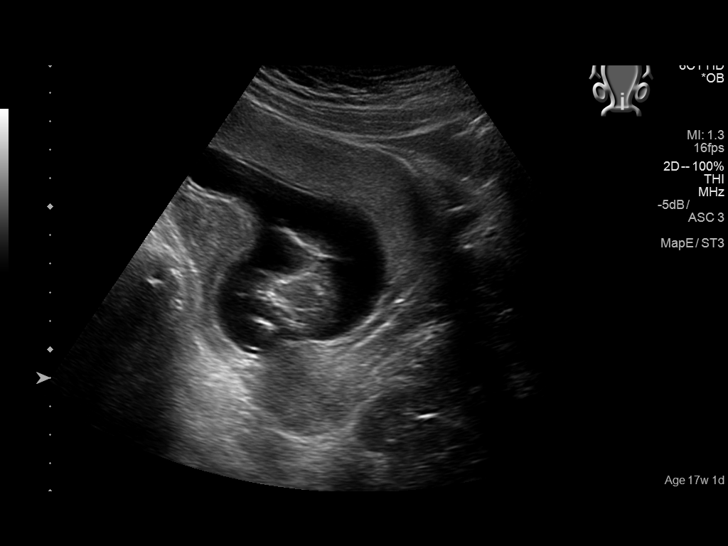
[im 21/44]
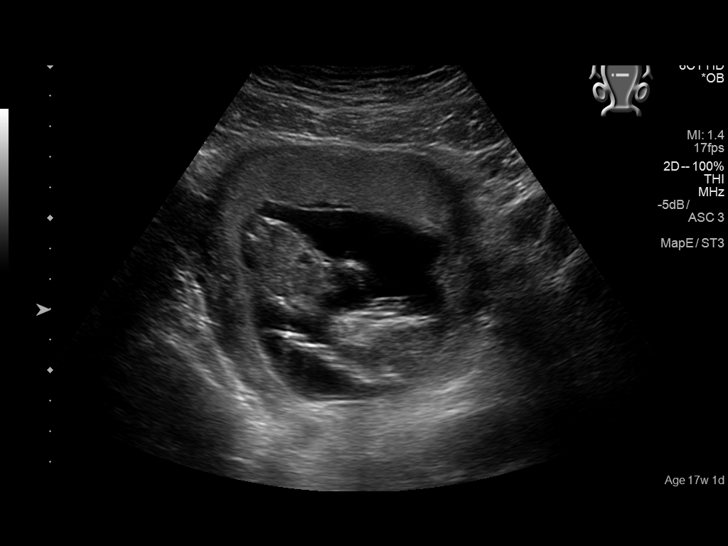
[im 24/44]
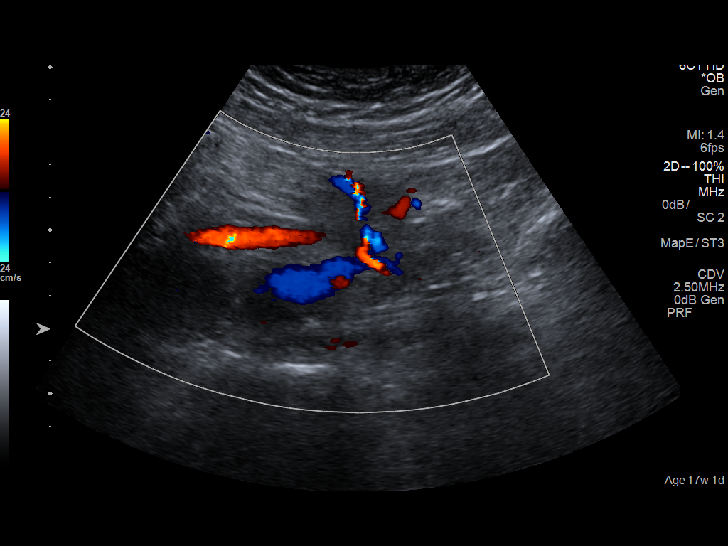
[im 28/44]
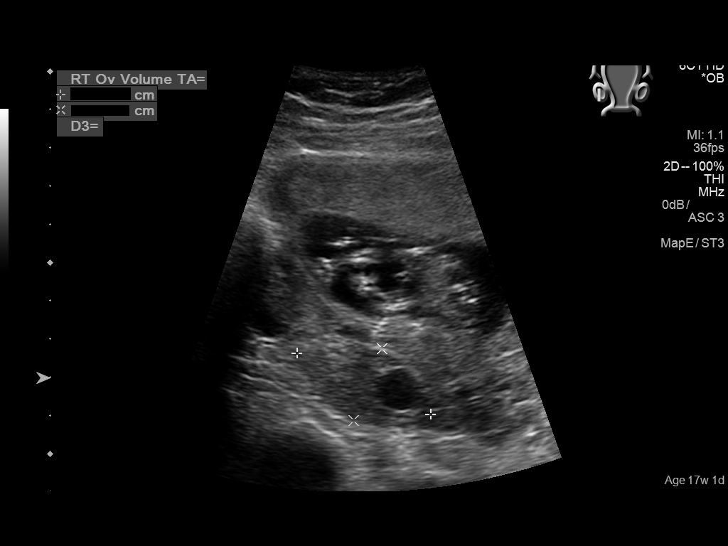
[im 31/44]
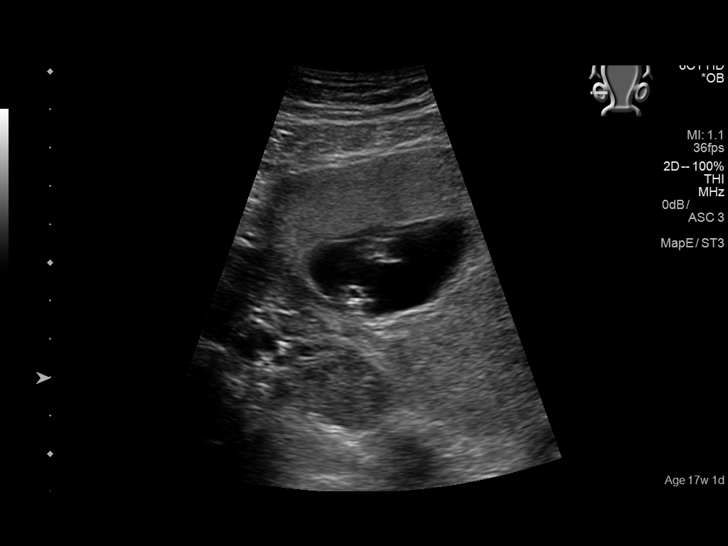
[im 34/44]
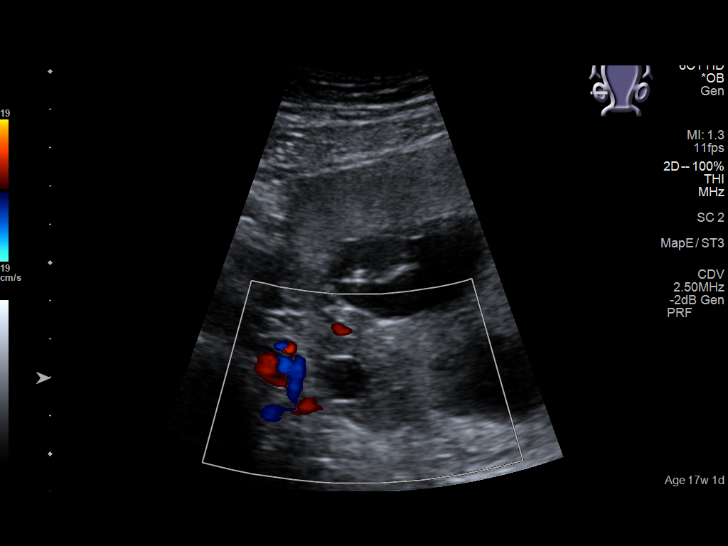
[im 37/44]
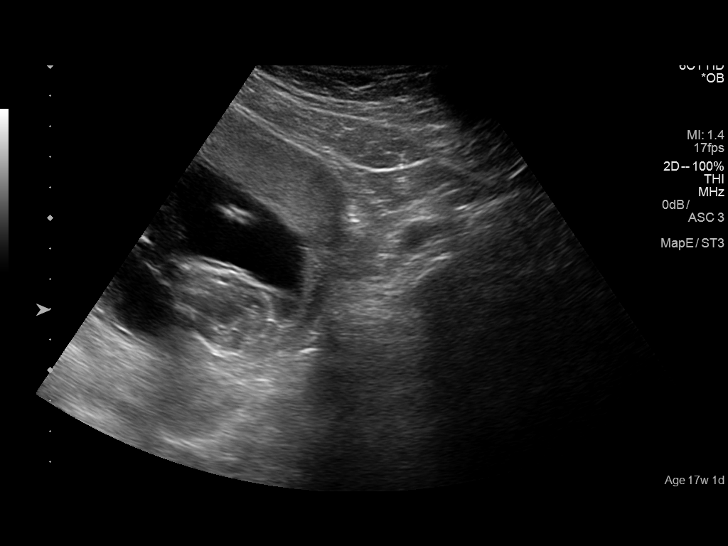
[im 40/44]
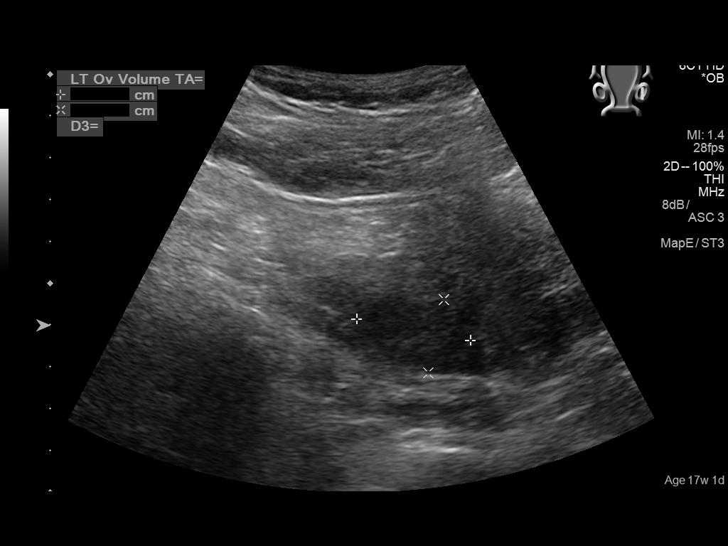
[im 44/44]
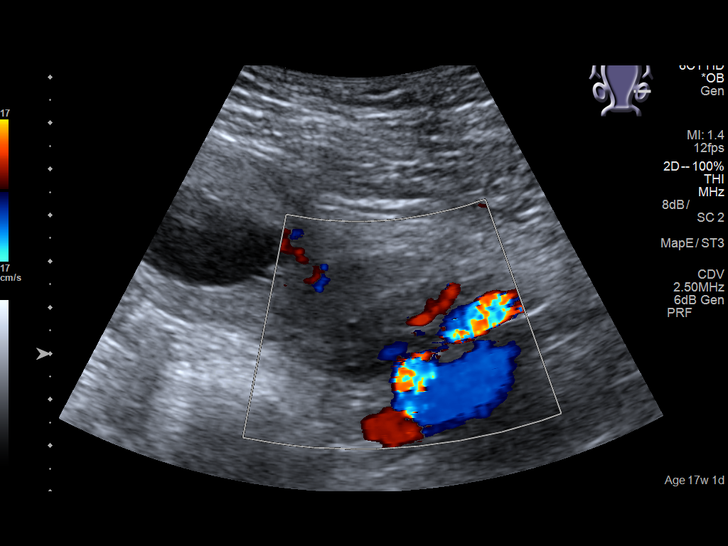

[14 of 28 positions shown; findings below may reference images not displayed]

FINDINGS: Intrauterine gestational sac: Present

Yolk sac:  Absent

Embryo:  Present

Cardiac Activity: Present

Heart Rate: 158 bpm

CRL:   77  mm   13 w 5 d                  US EDC: 06/03/2017

Subchorionic hemorrhage:  Absent

Maternal uterus/adnexae: Both ovaries are normal in appearance,
without adnexal mass.
IMPRESSION: 1. Intrauterine pregnancy of approximately 13 weeks 5 days with
fetal heart rate of 158 beats per minute.
2. No explanation for bleeding.

## 2018-09-01 LAB — HM PAP SMEAR: HM Pap smear: NEGATIVE

## 2019-01-27 ENCOUNTER — Other Ambulatory Visit: Payer: Self-pay | Admitting: Physician Assistant

## 2019-01-27 DIAGNOSIS — R102 Pelvic and perineal pain: Secondary | ICD-10-CM

## 2019-02-02 ENCOUNTER — Ambulatory Visit: Payer: Self-pay

## 2019-02-02 ENCOUNTER — Other Ambulatory Visit: Payer: Self-pay

## 2019-02-02 ENCOUNTER — Ambulatory Visit
Admission: RE | Admit: 2019-02-02 | Discharge: 2019-02-02 | Disposition: A | Discharge status: Home / Self Care | Payer: Self-pay | Source: Ambulatory Visit | Attending: Physician Assistant | Admitting: Physician Assistant

## 2019-02-02 DIAGNOSIS — R102 Pelvic and perineal pain: Secondary | ICD-10-CM | POA: Insufficient documentation

## 2019-02-12 ENCOUNTER — Other Ambulatory Visit: Payer: Self-pay | Admitting: Physician Assistant

## 2019-02-12 DIAGNOSIS — R102 Pelvic and perineal pain: Secondary | ICD-10-CM

## 2019-03-10 ENCOUNTER — Ambulatory Visit: Payer: Medicaid Other

## 2019-04-05 ENCOUNTER — Other Ambulatory Visit: Payer: Self-pay

## 2019-04-05 ENCOUNTER — Ambulatory Visit
Admission: RE | Admit: 2019-04-05 | Discharge: 2019-04-05 | Disposition: A | Discharge status: Home / Self Care | Payer: Self-pay | Source: Ambulatory Visit | Attending: Physician Assistant | Admitting: Physician Assistant

## 2019-04-05 DIAGNOSIS — R102 Pelvic and perineal pain: Secondary | ICD-10-CM | POA: Insufficient documentation

## 2019-09-01 ENCOUNTER — Ambulatory Visit: Payer: Self-pay

## 2019-09-01 ENCOUNTER — Other Ambulatory Visit: Payer: Self-pay

## 2019-09-09 ENCOUNTER — Ambulatory Visit: Payer: Medicaid Other

## 2021-12-04 DIAGNOSIS — Z01419 Encounter for gynecological examination (general) (routine) without abnormal findings: Secondary | ICD-10-CM | POA: Diagnosis not present

## 2021-12-27 DIAGNOSIS — R69 Illness, unspecified: Secondary | ICD-10-CM | POA: Diagnosis not present

## 2021-12-27 DIAGNOSIS — R030 Elevated blood-pressure reading, without diagnosis of hypertension: Secondary | ICD-10-CM | POA: Diagnosis not present

## 2021-12-27 DIAGNOSIS — E669 Obesity, unspecified: Secondary | ICD-10-CM | POA: Diagnosis not present

## 2021-12-27 DIAGNOSIS — R079 Chest pain, unspecified: Secondary | ICD-10-CM | POA: Diagnosis not present

## 2021-12-27 DIAGNOSIS — Z0000 Encounter for general adult medical examination without abnormal findings: Secondary | ICD-10-CM | POA: Diagnosis not present

## 2022-01-10 DIAGNOSIS — I10 Essential (primary) hypertension: Secondary | ICD-10-CM | POA: Diagnosis not present

## 2022-02-27 DIAGNOSIS — L578 Other skin changes due to chronic exposure to nonionizing radiation: Secondary | ICD-10-CM | POA: Diagnosis not present

## 2022-02-27 DIAGNOSIS — B078 Other viral warts: Secondary | ICD-10-CM | POA: Diagnosis not present

## 2022-02-27 DIAGNOSIS — Z86018 Personal history of other benign neoplasm: Secondary | ICD-10-CM | POA: Diagnosis not present

## 2022-02-27 DIAGNOSIS — D225 Melanocytic nevi of trunk: Secondary | ICD-10-CM | POA: Diagnosis not present

## 2022-02-27 DIAGNOSIS — Z09 Encounter for follow-up examination after completed treatment for conditions other than malignant neoplasm: Secondary | ICD-10-CM | POA: Diagnosis not present

## 2022-06-03 DIAGNOSIS — J020 Streptococcal pharyngitis: Secondary | ICD-10-CM | POA: Diagnosis not present

## 2022-06-03 DIAGNOSIS — I10 Essential (primary) hypertension: Secondary | ICD-10-CM | POA: Diagnosis not present

## 2022-06-24 DIAGNOSIS — J020 Streptococcal pharyngitis: Secondary | ICD-10-CM | POA: Diagnosis not present

## 2022-06-24 DIAGNOSIS — Z6838 Body mass index (BMI) 38.0-38.9, adult: Secondary | ICD-10-CM | POA: Diagnosis not present

## 2022-06-24 DIAGNOSIS — J029 Acute pharyngitis, unspecified: Secondary | ICD-10-CM | POA: Diagnosis not present

## 2022-07-10 ENCOUNTER — Ambulatory Visit: Payer: Self-pay | Admitting: Family Medicine

## 2022-07-10 DIAGNOSIS — Z6841 Body Mass Index (BMI) 40.0 and over, adult: Secondary | ICD-10-CM | POA: Diagnosis not present

## 2022-07-10 DIAGNOSIS — I10 Essential (primary) hypertension: Secondary | ICD-10-CM | POA: Diagnosis not present

## 2022-07-10 DIAGNOSIS — G43809 Other migraine, not intractable, without status migrainosus: Secondary | ICD-10-CM | POA: Diagnosis not present

## 2022-08-01 ENCOUNTER — Other Ambulatory Visit: Payer: Self-pay | Admitting: Family Medicine

## 2022-08-01 DIAGNOSIS — G43809 Other migraine, not intractable, without status migrainosus: Secondary | ICD-10-CM

## 2022-08-15 ENCOUNTER — Ambulatory Visit
Admission: RE | Admit: 2022-08-15 | Discharge: 2022-08-15 | Disposition: A | Discharge status: Home / Self Care | Payer: 59 | Source: Ambulatory Visit | Attending: Family Medicine | Admitting: Family Medicine

## 2022-08-15 DIAGNOSIS — J320 Chronic maxillary sinusitis: Secondary | ICD-10-CM | POA: Diagnosis not present

## 2022-08-15 DIAGNOSIS — G43809 Other migraine, not intractable, without status migrainosus: Secondary | ICD-10-CM

## 2022-08-15 DIAGNOSIS — G43909 Migraine, unspecified, not intractable, without status migrainosus: Secondary | ICD-10-CM | POA: Diagnosis not present

## 2022-08-15 MED ORDER — GADOBENATE DIMEGLUMINE 529 MG/ML IV SOLN
20.0000 mL | Freq: Once | INTRAVENOUS | Status: AC | PRN
  Administered 2022-08-15: 20 mL via INTRAVENOUS

## 2022-09-06 DIAGNOSIS — J301 Allergic rhinitis due to pollen: Secondary | ICD-10-CM | POA: Diagnosis not present

## 2022-09-13 DIAGNOSIS — H53149 Visual discomfort, unspecified: Secondary | ICD-10-CM | POA: Diagnosis not present

## 2022-09-13 DIAGNOSIS — R519 Headache, unspecified: Secondary | ICD-10-CM | POA: Diagnosis not present

## 2022-09-13 DIAGNOSIS — R69 Illness, unspecified: Secondary | ICD-10-CM | POA: Diagnosis not present

## 2022-09-24 DIAGNOSIS — J301 Allergic rhinitis due to pollen: Secondary | ICD-10-CM | POA: Diagnosis not present

## 2022-10-17 DIAGNOSIS — I10 Essential (primary) hypertension: Secondary | ICD-10-CM | POA: Diagnosis not present

## 2022-10-18 DIAGNOSIS — H53149 Visual discomfort, unspecified: Secondary | ICD-10-CM | POA: Diagnosis not present

## 2022-10-18 DIAGNOSIS — R519 Headache, unspecified: Secondary | ICD-10-CM | POA: Diagnosis not present

## 2022-10-18 DIAGNOSIS — R69 Illness, unspecified: Secondary | ICD-10-CM | POA: Diagnosis not present

## 2022-10-22 DIAGNOSIS — G43809 Other migraine, not intractable, without status migrainosus: Secondary | ICD-10-CM | POA: Diagnosis not present

## 2022-10-22 DIAGNOSIS — I10 Essential (primary) hypertension: Secondary | ICD-10-CM | POA: Diagnosis not present

## 2022-12-20 DIAGNOSIS — Z1331 Encounter for screening for depression: Secondary | ICD-10-CM | POA: Diagnosis not present

## 2022-12-20 DIAGNOSIS — Z01411 Encounter for gynecological examination (general) (routine) with abnormal findings: Secondary | ICD-10-CM | POA: Diagnosis not present

## 2022-12-20 DIAGNOSIS — Z30011 Encounter for initial prescription of contraceptive pills: Secondary | ICD-10-CM | POA: Diagnosis not present

## 2022-12-20 DIAGNOSIS — R350 Frequency of micturition: Secondary | ICD-10-CM | POA: Diagnosis not present

## 2023-01-10 DIAGNOSIS — Z0000 Encounter for general adult medical examination without abnormal findings: Secondary | ICD-10-CM | POA: Diagnosis not present

## 2023-01-10 DIAGNOSIS — G43809 Other migraine, not intractable, without status migrainosus: Secondary | ICD-10-CM | POA: Diagnosis not present

## 2023-01-10 DIAGNOSIS — Z6841 Body Mass Index (BMI) 40.0 and over, adult: Secondary | ICD-10-CM | POA: Diagnosis not present

## 2023-01-10 DIAGNOSIS — I10 Essential (primary) hypertension: Secondary | ICD-10-CM | POA: Diagnosis not present

## 2023-01-17 DIAGNOSIS — H53149 Visual discomfort, unspecified: Secondary | ICD-10-CM | POA: Diagnosis not present

## 2023-01-17 DIAGNOSIS — F40298 Other specified phobia: Secondary | ICD-10-CM | POA: Diagnosis not present

## 2023-01-17 DIAGNOSIS — R519 Headache, unspecified: Secondary | ICD-10-CM | POA: Diagnosis not present

## 2023-02-27 DIAGNOSIS — R946 Abnormal results of thyroid function studies: Secondary | ICD-10-CM | POA: Diagnosis not present

## 2023-03-12 DIAGNOSIS — F40298 Other specified phobia: Secondary | ICD-10-CM | POA: Diagnosis not present

## 2023-03-12 DIAGNOSIS — R519 Headache, unspecified: Secondary | ICD-10-CM | POA: Diagnosis not present

## 2023-03-12 DIAGNOSIS — H53149 Visual discomfort, unspecified: Secondary | ICD-10-CM | POA: Diagnosis not present

## 2023-03-21 DIAGNOSIS — D225 Melanocytic nevi of trunk: Secondary | ICD-10-CM | POA: Diagnosis not present

## 2023-03-21 DIAGNOSIS — Z86018 Personal history of other benign neoplasm: Secondary | ICD-10-CM | POA: Diagnosis not present

## 2023-03-21 DIAGNOSIS — L578 Other skin changes due to chronic exposure to nonionizing radiation: Secondary | ICD-10-CM | POA: Diagnosis not present

## 2023-03-21 DIAGNOSIS — D485 Neoplasm of uncertain behavior of skin: Secondary | ICD-10-CM | POA: Diagnosis not present

## 2023-04-10 DIAGNOSIS — D235 Other benign neoplasm of skin of trunk: Secondary | ICD-10-CM | POA: Diagnosis not present

## 2023-04-10 DIAGNOSIS — D225 Melanocytic nevi of trunk: Secondary | ICD-10-CM | POA: Diagnosis not present

## 2023-05-08 DIAGNOSIS — D235 Other benign neoplasm of skin of trunk: Secondary | ICD-10-CM | POA: Diagnosis not present

## 2023-06-05 DIAGNOSIS — H53149 Visual discomfort, unspecified: Secondary | ICD-10-CM | POA: Diagnosis not present

## 2023-06-05 DIAGNOSIS — R519 Headache, unspecified: Secondary | ICD-10-CM | POA: Diagnosis not present

## 2023-06-05 DIAGNOSIS — F40298 Other specified phobia: Secondary | ICD-10-CM | POA: Diagnosis not present

## 2023-07-04 DIAGNOSIS — N939 Abnormal uterine and vaginal bleeding, unspecified: Secondary | ICD-10-CM | POA: Diagnosis not present

## 2023-07-04 DIAGNOSIS — N898 Other specified noninflammatory disorders of vagina: Secondary | ICD-10-CM | POA: Diagnosis not present

## 2023-07-04 DIAGNOSIS — Z6841 Body Mass Index (BMI) 40.0 and over, adult: Secondary | ICD-10-CM | POA: Diagnosis not present

## 2023-07-04 DIAGNOSIS — G43809 Other migraine, not intractable, without status migrainosus: Secondary | ICD-10-CM | POA: Diagnosis not present

## 2023-07-04 DIAGNOSIS — R102 Pelvic and perineal pain: Secondary | ICD-10-CM | POA: Diagnosis not present

## 2023-07-04 DIAGNOSIS — I10 Essential (primary) hypertension: Secondary | ICD-10-CM | POA: Diagnosis not present

## 2023-09-03 DIAGNOSIS — D251 Intramural leiomyoma of uterus: Secondary | ICD-10-CM | POA: Diagnosis not present

## 2023-09-03 DIAGNOSIS — R102 Pelvic and perineal pain: Secondary | ICD-10-CM | POA: Diagnosis not present

## 2023-10-06 DIAGNOSIS — I10 Essential (primary) hypertension: Secondary | ICD-10-CM | POA: Diagnosis not present

## 2023-10-06 DIAGNOSIS — Z6841 Body Mass Index (BMI) 40.0 and over, adult: Secondary | ICD-10-CM | POA: Diagnosis not present

## 2023-12-29 DIAGNOSIS — Z01411 Encounter for gynecological examination (general) (routine) with abnormal findings: Secondary | ICD-10-CM | POA: Diagnosis not present

## 2023-12-29 DIAGNOSIS — R102 Pelvic and perineal pain: Secondary | ICD-10-CM | POA: Diagnosis not present

## 2023-12-29 DIAGNOSIS — Z1239 Encounter for other screening for malignant neoplasm of breast: Secondary | ICD-10-CM | POA: Diagnosis not present

## 2023-12-29 DIAGNOSIS — R1032 Left lower quadrant pain: Secondary | ICD-10-CM | POA: Diagnosis not present

## 2024-01-16 DIAGNOSIS — R519 Headache, unspecified: Secondary | ICD-10-CM | POA: Diagnosis not present

## 2024-01-16 DIAGNOSIS — F40298 Other specified phobia: Secondary | ICD-10-CM | POA: Diagnosis not present

## 2024-01-16 DIAGNOSIS — R1032 Left lower quadrant pain: Secondary | ICD-10-CM | POA: Diagnosis not present

## 2024-01-16 DIAGNOSIS — H53149 Visual discomfort, unspecified: Secondary | ICD-10-CM | POA: Diagnosis not present

## 2024-01-21 DIAGNOSIS — R1032 Left lower quadrant pain: Secondary | ICD-10-CM | POA: Diagnosis not present

## 2024-01-21 DIAGNOSIS — R102 Pelvic and perineal pain: Secondary | ICD-10-CM | POA: Diagnosis not present

## 2024-01-21 DIAGNOSIS — N941 Unspecified dyspareunia: Secondary | ICD-10-CM | POA: Diagnosis not present

## 2024-03-18 DIAGNOSIS — M25562 Pain in left knee: Secondary | ICD-10-CM | POA: Diagnosis not present

## 2024-03-23 DIAGNOSIS — Z86018 Personal history of other benign neoplasm: Secondary | ICD-10-CM | POA: Diagnosis not present

## 2024-03-23 DIAGNOSIS — D229 Melanocytic nevi, unspecified: Secondary | ICD-10-CM | POA: Diagnosis not present

## 2024-03-23 DIAGNOSIS — L578 Other skin changes due to chronic exposure to nonionizing radiation: Secondary | ICD-10-CM | POA: Diagnosis not present

## 2024-04-05 DIAGNOSIS — Z0000 Encounter for general adult medical examination without abnormal findings: Secondary | ICD-10-CM | POA: Diagnosis not present

## 2024-04-05 DIAGNOSIS — G43809 Other migraine, not intractable, without status migrainosus: Secondary | ICD-10-CM | POA: Diagnosis not present

## 2024-04-05 DIAGNOSIS — N912 Amenorrhea, unspecified: Secondary | ICD-10-CM | POA: Diagnosis not present

## 2024-04-05 DIAGNOSIS — Z6841 Body Mass Index (BMI) 40.0 and over, adult: Secondary | ICD-10-CM | POA: Diagnosis not present

## 2024-04-05 DIAGNOSIS — Z1331 Encounter for screening for depression: Secondary | ICD-10-CM | POA: Diagnosis not present

## 2024-04-05 DIAGNOSIS — J309 Allergic rhinitis, unspecified: Secondary | ICD-10-CM | POA: Diagnosis not present

## 2024-04-07 ENCOUNTER — Other Ambulatory Visit
Admission: RE | Admit: 2024-04-07 | Discharge: 2024-04-07 | Disposition: A | Discharge status: Home / Self Care | Source: Ambulatory Visit | Attending: Family Medicine | Admitting: Family Medicine

## 2024-04-07 DIAGNOSIS — J309 Allergic rhinitis, unspecified: Secondary | ICD-10-CM | POA: Insufficient documentation

## 2024-04-07 DIAGNOSIS — Z0000 Encounter for general adult medical examination without abnormal findings: Secondary | ICD-10-CM | POA: Diagnosis not present

## 2024-04-07 DIAGNOSIS — G43809 Other migraine, not intractable, without status migrainosus: Secondary | ICD-10-CM | POA: Insufficient documentation

## 2024-04-08 LAB — HCG, QUANTITATIVE, PREGNANCY: hCG, Beta Chain, Quant, S: <1 m[IU]/mL (ref <5)

## 2024-04-30 DIAGNOSIS — J028 Acute pharyngitis due to other specified organisms: Secondary | ICD-10-CM | POA: Diagnosis not present

## 2024-04-30 DIAGNOSIS — B9789 Other viral agents as the cause of diseases classified elsewhere: Secondary | ICD-10-CM | POA: Diagnosis not present

## 2024-04-30 DIAGNOSIS — Z20818 Contact with and (suspected) exposure to other bacterial communicable diseases: Secondary | ICD-10-CM | POA: Diagnosis not present

## 2024-05-11 DIAGNOSIS — H53149 Visual discomfort, unspecified: Secondary | ICD-10-CM | POA: Diagnosis not present

## 2024-05-11 DIAGNOSIS — F40298 Other specified phobia: Secondary | ICD-10-CM | POA: Diagnosis not present

## 2024-05-11 DIAGNOSIS — R519 Headache, unspecified: Secondary | ICD-10-CM | POA: Diagnosis not present

## 2024-07-26 ENCOUNTER — Encounter: Payer: Self-pay | Admitting: Physical Therapy

## 2024-07-26 ENCOUNTER — Ambulatory Visit: Attending: Certified Nurse Midwife | Admitting: Physical Therapy

## 2024-07-26 DIAGNOSIS — R293 Abnormal posture: Secondary | ICD-10-CM | POA: Diagnosis not present

## 2024-07-26 DIAGNOSIS — M5459 Other low back pain: Secondary | ICD-10-CM | POA: Insufficient documentation

## 2024-07-26 DIAGNOSIS — M533 Sacrococcygeal disorders, not elsewhere classified: Secondary | ICD-10-CM | POA: Diagnosis not present

## 2024-07-26 NOTE — Therapy (Unsigned)
 OUTPATIENT PHYSICAL THERAPY EVALUATION   Patient Name: Shannon Madden MRN: [id number] DOB:[birth year], 28 y.o., female Today's Date: 07/26/2024   PT End of Session - 07/26/24 1202     Visit Number 1    Number of Visits 10    Date for PT Re-Evaluation 10/04/24    PT Start Time 1150    PT Stop Time 1235    PT Time Calculation (min) 45 min    Activity Tolerance Patient tolerated treatment well;No increased pain    Behavior During Therapy WFL for tasks assessed/performed          Past Medical History:  Diagnosis Date   Medical history non-contributory    History reviewed. No pertinent surgical history. Patient Active Problem List   Diagnosis Date Noted   Sepsis (HCC) 05/20/2017   Labor and delivery, indication for care 05/06/2017   Indication for care in labor and delivery, antepartum 05/05/2017   Echogenic intracardiac focus of fetus on prenatal ultrasound 01/30/2017   Pelviectasis, renal 01/30/2017   MDD (major depressive disorder), single episode, severe (HCC) 12/24/2013    PCP:  Gaetana Haddock, PA-C   REFERRING PROVIDER: Edsel Blush CNM   REFERRING DIAG:  R10.32 (ICD-10-CM) - Left lower quadrant pain R10.2 (ICD-10-CM) - Pelvic and perineal pain N94.10 (ICD-10-CM) - Unspecified dyspareunia  Rationale for Evaluation and Treatment Rehabilitation  THERAPY DIAG:  Sacrococcygeal disorders, not elsewhere classified  Other low back pain  Abnormal posture  ONSET DATE:   SUBJECTIVE:                                                                                                                                                                                           SUBJECTIVE STATEMENT: 1)  L pelvic pain:  located in the L groin area. It started after childbirth 7 years. Pt started getting health insurance 2 years ago and her doctor ordered imaging.  There was no findings for the cause of her pain. Pt has had ruptured cysts in the past and there are still some  small ones on her L ovary.  Her Certified midwifed referred her to Pelvic PT to further help with pain . Pain occurs with sexual intercourse during deep penetration. It feels like pulsating feeling on her L side. Worst with all fours position .  Pain 10/10.  Pt avoids certain positions now.  Pain also comes and goes during random times during any position not related to sexual intercourse. Pt noticed pain happens when she gets up from the floor because she sits on the floor occur and during sit to stand  Pt also feels pelvic pain  and LBP  when she gets up from the floor, sit to stand, and on the floor with autistic son.  She also has to  change diapers , pick up and carry sometimes her son who weights 60 lbs  .    2) dribbling of urine after urination when getting up to stand:  occurs more recently in the past year. It occurs 75% of the time with urination .  Denied lowered pelvic organ / pressure sensation.    3)  B LBP - upper and lower, non radiating.  : Get up from sitting on the floor and chair and   hurts with rolling to get out of bed. Sleeps on her side with body pillow with her leg over her pillow. Pain 7-8/10     PERTINENT HISTORY:    PAIN:  Are you having pain? Yes: see above   PRECAUTIONS: No   WEIGHT BEARING RESTRICTIONS: No  FALLS:  Has patient fallen in last 6 months? 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     Pt's musculoskeletal assessment revealed uneven pelvic girdle and shoulder height, asymmetries to gait pattern, limited spinal /pelvic mobility, dyscoordination and strength of pelvic floor mm, hip weakness, poor body mechanics which places strain on the abdominal/pelvic floor mm. These are deficits that indicate an ineffective intraabdominal pressure system associated with increased risk for pt's Sx.     Pt will benefit from propioception/ coordination/ body mechanics training and education with gravity-loaded tasks at work and home and  fitness modifications in order to gain a more effective intraabdominal pressure system to minimize Sx. Advised pt to not perform sit-ups and crunches as these movement patterns lead to more downward forces on the pelvic floor, negatively impacting abdominopelvic/spinal dysfunctions.   Pt was provided education on etiology of Sx with anatomy, physiology explanation with images along with the benefits of customized pelvic PT Tx based on pt's medical conditions and musculoskeletal deficits.  Explained the physiology of deep core mm coordination and roles of pelvic floor function in urination, defecation, sexual function, and postural control with deep core mm system, and the role of posture and alignment to help pelvic issues.    Regional interdependent approaches will yield greater benefits in pt's POC due to the complexity of pt's medical Hx and the significant impact their Sx have had on their QOL. Pt would benefit from a biopsychosocial approach to yield optimal outcomes. Plan to build interdisciplinary team with pt's providers to optimize patient-centered care as needed.   Following Tx today which pt tolerated without complaints,  pt demo'd proper body mechanics to minimize straining pelvic floor.  Plan to address realignment of spine/ pelvis at next session to help promote optimize IAP system for  improved pelvic floor function,  trunk stability, gait, balance, stabilization with mobility tasks.  Plan to address pelvic floor issues once pelvis and spine are realigned to yield better outcomes.                                                       Pt benefits from skilled PT.    OBJECTIVE IMPAIRMENTS decreased activity tolerance, decreased coordination, decreased endurance, decreased mobility, difficulty walking, decreased ROM, decreased strength, decreased safety awareness, hypomobility, increased muscle spasms, impaired flexibility, improper body mechanics, postural dysfunction, and pain   ACTIVITY LIMITATIONS  self-care,  sleep, home chores, work tasks    PARTICIPATION LIMITATIONS:  community   PERSONAL FACTORS   affecting patient's functional outcome:    REHAB POTENTIAL: Good   CLINICAL DECISION MAKING: Evolving/moderate complexity   EVALUATION COMPLEXITY: Moderate    PATIENT EDUCATION:    Education details: Showed pt anatomy images. Explained muscles attachments/ connection, physiology of deep core system/ spinal- thoracic-pelvis-lower kinetic chain as they relate to pt's presentation, Sx, and past Hx. Explained what and how these areas of deficits need to be restored to balance and function    See Therapeutic activity / neuromuscular re-education section  Answered pt's questions.   Person educated: Patient Education method: Explanation, Demonstration, Tactile cues, Verbal cues, and Handouts Education comprehension: verbalized understanding, returned demonstration, verbal cues required, tactile cues required, and needs further education     PLAN: PT FREQUENCY: 1x/week   PT DURATION: 10 weeks   PLANNED INTERVENTIONS:   Gait training;Stair training;Functional mobility training;DME Instruction;Therapeutic activities;Therapeutic exercise;Balance training;Neuromuscular re-education;Patient/family education;Vestibular;Visual/perceptual remediation/compensation;Passive  range of motion;Moist Heat;Cryotherapy;Traction;Canalith Repostioning;Joint Manipulations;Manual lymph drainage;Manual techniques;Scar mobilization;Energy conservation;Dry needling;ADLs/Self Care Home Management;Biofeedback;Electrical Stimulation;Taping    PLAN FOR NEXT SESSION: See clinical impression for plan     GOALS: Goals reviewed with patient? Yes  SHORT TERM GOALS: Target date: 08/23/2024    Pt will demo IND with HEP                    Baseline: Not IND            Goal status: INITIAL   LONG TERM GOALS: Target date: 10/04/2024    1.Pt will demo proper deep core coordination without chest breathing and optimal excursion of diaphragm/pelvic floor in order to promote spinal stability and pelvic floor function  Baseline: dyscoordination Goal status: INITIAL  2.  Pt will demo proper body mechanics in against gravity tasks and ADLs  work tasks, fitness  to minimize straining pelvic floor / back    Baseline: not IND, improper form that places strain on pelvic floor  Goal status: INITIAL    3. Pt will demo increased gait speed > 1.3 m/s with reciprocal gait pattern, longer stride length  in order to ambulate safely in community and return to fitness routine  Baseline:  Goal status: INITIAL    4. Pt will demo levelled pelvic girdle and shoulder height in order to progress to deep core strengthening HEP and restore mobility at spine, pelvis, gait, posture minimize falls, and improve balance  Baseline:  Goal status: INITIAL   5. Pt will improve PFDI-7 questionnaire to  pts  score change  to demo improved QOL  Baseline:    ( greater pts indicate greater negative impact on QOL)     pts  ( total)  pts  ( UIQ-7 )    pts  ( CRAIQ-7 )    pts  ( POPIQ-7 )  Baseline:  Goal status: INITIAL  6. Pt will demo proper body mechanics with stand<> floor, sit <> floor, and with sitting on floor , and picking up son from  floor or hold him and report no pelvic pain during these  transfers in order to home school and change diapers for autistic    68 yr old son who weights 60 lbs  .  Baseline:  Pt noticed pain happens when she gets up from the floor because she sits on the floor occur and during sit to stand  Goal status: INITIAL  7.  Pt will report post urine dribble occurring < 50% of the time  Baseline: dribbling of urine after urination when getting up to stand:  occurs more recently in the past year. It occurs 75% of the time with urination .  Goal status: INITIAL  8. Pt will demo improved alignment with t/f from floor to stand, sit to stand and rolling out of bed and report decreased LBP < 2/10 Baseline::Get up from sitting on the floor and chair and   hurts with rolling to get out of bed. Sleeps on her side with body pillow with her leg over her pillow. Pain 7-8/10  Goal status: INITIAL   Pia Lupe Plump, PT 07/26/2024, 12:12 PM

## 2024-07-26 NOTE — Patient Instructions (Addendum)
 Standing posture  Shift navel forward slightly, shift weight across heel and ball mound, unlock knees   Width of feet under hips not together   __  Sitting posture  Feet planted on ground , hip width   __   Proper body mechanics with getting out of a chair to decrease strain  on back &pelvic floor   Avoid holding your breath when Getting out of the chair:  Scoot to front part of chair chair Heels behind knees, feet are hip width apart, nose over toes  Inhale like you are smelling roses Exhale to stand   ___   Floor to stand for pelvic stability :     Transition from standing to floor :  stand to floor transfer :      _ slow     _ mini squat      _ crawl down with one hand on thigh      _downward dog  - >  shoulders down and back-  walk the dog ( knee bents to lengthe hamstrings)      Floor to stand :   downward dog   Feet are wider than hips, crawl hands back, butt is back, knees behind toes -> squat  Hands at waist , elbows back, chest lifts

## 2024-08-02 ENCOUNTER — Encounter: Admitting: Physical Therapy

## 2024-08-02 ENCOUNTER — Ambulatory Visit: Admitting: Physical Therapy

## 2024-08-02 DIAGNOSIS — M5459 Other low back pain: Secondary | ICD-10-CM

## 2024-08-02 DIAGNOSIS — R293 Abnormal posture: Secondary | ICD-10-CM

## 2024-08-02 DIAGNOSIS — M533 Sacrococcygeal disorders, not elsewhere classified: Secondary | ICD-10-CM

## 2024-08-02 NOTE — Therapy (Unsigned)
 OUTPATIENT PHYSICAL THERAPY TREATMENT    Patient Name: Shannon Madden MRN: [id number] DOB:[birth year], 28 y.o., female Today's Date: 08/02/2024   PT End of Session - 08/02/24 0852     Visit Number 2    Number of Visits 10    Date for PT Re-Evaluation 10/04/24    PT Start Time 0847    PT Stop Time 0930    PT Time Calculation (min) 43 min    Activity Tolerance Patient tolerated treatment well;No increased pain    Behavior During Therapy WFL for tasks assessed/performed          Past Medical History:  Diagnosis Date   Medical history non-contributory    No past surgical history on file. Patient Active Problem List   Diagnosis Date Noted   Sepsis (HCC) 05/20/2017   Labor and delivery, indication for care 05/06/2017   Indication for care in labor and delivery, antepartum 05/05/2017   Echogenic intracardiac focus of fetus on prenatal ultrasound 01/30/2017   Pelviectasis, renal 01/30/2017   MDD (major depressive disorder), single episode, severe (HCC) 12/24/2013    PCP:  Gaetana Haddock, PA-C   REFERRING PROVIDER: Edsel Blush CNM   REFERRING DIAG:  R10.32 (ICD-10-CM) - Left lower quadrant pain R10.2 (ICD-10-CM) - Pelvic and perineal pain N94.10 (ICD-10-CM) - Unspecified dyspareunia  Rationale for Evaluation and Treatment Rehabilitation  THERAPY DIAG:  Sacrococcygeal disorders, not elsewhere classified  Other low back pain  Abnormal posture  ONSET DATE:   SUBJECTIVE:  SUBJECTIVE STATEMENT Today:  Pt has been paying attention to her sitting posture and correcting herself        .  Pt reports she will be seeing GI doctor this Friday about her loose stools. Pt is concerned about not knowing where a bathroom is when she eats because she has loose stools and feels anxious. Pt is anxious before eating and is worried about the repercussions of eating. Pt eats on the go. 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                                                                                                                                                                                SUBJECTIVE STATEMENT ON EVAL  07/26/24  1)  L pelvic pain:  located in the L groin area. It started after childbirth 7 years. Pt started getting health insurance 2 years ago and her doctor ordered imaging.  There was no findings for the cause of her pain. Pt has had ruptured cysts in the past and there are still some small ones  on her L ovary.  Her Certified midwifed referred her to Pelvic PT to further help with pain . Pain occurs with sexual intercourse during deep penetration. It feels like pulsating feeling on her L side. Worst with all fours position .  Pain 10/10.  Pt avoids certain positions now.  Pain also comes and goes during random times during any position not related to sexual intercourse. Pt noticed pain happens when she gets up from the floor because she sits on the floor occur and during sit to stand  Pt also feels pelvic pain  and LBP when she gets up from the floor, sit to stand, and on the floor with autistic son.  She also has to  change diapers , pick up and carry sometimes her son who weights 60 lbs  .    2) dribbling of urine after urination when getting up to stand:  occurs more recently in the past year. It occurs 75% of the time with urination .  Denied lowered pelvic organ / pressure sensation.    3)  B LBP - upper and lower, non radiating.  : Get up from sitting on the floor and chair and   hurts with rolling to get out of bed. Sleeps on her side with body pillow with her leg over her pillow. Pain 7-8/10     PERTINENT HISTORY:    PAIN:  Are you having pain? Yes: see above   PRECAUTIONS: No   WEIGHT BEARING RESTRICTIONS: No  FALLS:  Has patient fallen in last 6 months? 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  Explained the physiology of deep core mm coordination and roles of pelvic floor function in urination, defecation, sexual function, and postural control with deep core mm system, and the role of posture and alignment to help pelvic issues.    Regional interdependent approaches will yield greater benefits in pt's POC.    Following Tx today which pt tolerated without complaints,  pt demo'd proper body mechanics to minimize straining pelvic floor. Cued for alignment and proprioception of pelvis and LKC and spine in  sit to stand and for proper sitting posture and stand <> floor transition  to activate deep core system and provide more pelvic stability. Pt demo'd correctly post Tx Plan to address realignment of spine/ pelvis at next session to help promote optimize IAP system for improved pelvic floor function, trunk stability, gait, balance, stabilization with mobility tasks.  Plan to address pelvic floor issues once pelvis and spine are realigned to yield better outcomes.  Pt benefits from skilled PT.    OBJECTIVE IMPAIRMENTS decreased activity tolerance, decreased coordination, decreased endurance, decreased mobility, difficulty walking, decreased ROM, decreased strength, decreased safety awareness, hypomobility, increased muscle spasms, impaired flexibility, improper body mechanics, postural dysfunction, and pain    ACTIVITY LIMITATIONS  self-care,  sleep, home chores, work tasks    PARTICIPATION LIMITATIONS:  community   PERSONAL FACTORS   affecting patient's functional outcome:    REHAB POTENTIAL: Good   CLINICAL DECISION MAKING: Evolving/moderate complexity   EVALUATION COMPLEXITY: Moderate    PATIENT EDUCATION:    Education details: Showed pt anatomy images. Explained muscles attachments/ connection, physiology of deep core system/ spinal- thoracic-pelvis-lower kinetic chain as they relate to pt's presentation, Sx, and past Hx. Explained what and how these areas of deficits need to be restored to balance and function    See Therapeutic activity / neuromuscular re-education section  Answered pt's questions.   Person educated: Patient Education method: Explanation, Demonstration, Tactile cues, Verbal cues, and Handouts Education comprehension: verbalized understanding, returned demonstration, verbal cues required, tactile cues required, and needs further education     PLAN: PT FREQUENCY: 1x/week   PT DURATION: 10 weeks   PLANNED INTERVENTIONS:   Gait training;Stair training;Functional mobility training;DME Instruction;Therapeutic activities;Therapeutic exercise;Balance training;Neuromuscular re-education;Patient/family education;Vestibular;Visual/perceptual remediation/compensation;Passive range of motion;Moist Heat;Cryotherapy;Traction;Canalith Repostioning;Joint Manipulations;Manual lymph drainage;Manual techniques;Scar mobilization;Energy conservation;Dry needling;ADLs/Self Care Home Management;Biofeedback;Electrical Stimulation;Taping    PLAN FOR NEXT SESSION: See clinical impression for plan     GOALS: Goals reviewed with patient? Yes  SHORT TERM GOALS: Target date: 08/23/2024    Pt will demo IND with HEP                    Baseline: Not IND            Goal status: INITIAL   LONG TERM GOALS: Target date: 10/04/2024    1.Pt will demo proper deep core coordination  without chest breathing and optimal excursion of diaphragm/pelvic floor in order to promote spinal stability and pelvic floor function  Baseline: dyscoordination Goal status: INITIAL  2.  Pt will demo proper body mechanics in against gravity tasks and ADLs  work tasks, fitness  to minimize straining pelvic floor / back    Baseline: not IND, improper form that places strain on pelvic floor  Goal status: INITIAL    3. Pt will demo increased gait speed > 1.3 m/s with reciprocal gait pattern, longer stride length  in order to ambulate safely in community and return to fitness routine  Baseline: 1.18 m/s, limited posterior rotation of R rib,  R hip hike  Goal status: INITIAL    4. Pt will demo levelled pelvic girdle and shoulder height in order to progress to deep core strengthening HEP and restore mobility at spine, pelvis, gait, posture minimize falls, and improve balance  Baseline:  SI assessment  R shoulder/ R pelvic lowered    Goal status: INITIAL   5. Pt will improve PFDI-7 questionnaire to  pts  score change  to demo improved QOL  Baseline:    ( greater pts indicate greater negative impact on QOL)   68   pts  ( total)  10   pts  ( UIQ-7 )   11 pts  ( CRAIQ-7 )    48  pts  ( POPIQ-7 )  Goal status: INITIAL  6. Pt will demo proper body mechanics with stand<> floor, sit <> floor, and with sitting on floor , and picking up son from  floor or hold him and report no pelvic pain during these transfers in order to home school and change diapers for autistic    35 yr old son who weights 60 lbs  .  Baseline:  Pt noticed pain happens when she gets up from the floor because she sits on the floor occur and during sit to stand  Goal status: INITIAL  7.  Pt will report post urine dribble occurring < 50% of the time  Baseline: dribbling of urine after urination when getting up to stand:  occurs more recently in the past year. It occurs 75% of the time with urination .  Goal status:  INITIAL  8. Pt will demo improved alignment with t/f from floor to stand, sit to stand and rolling out of bed and report decreased LBP < 2/10 Baseline::Get up from sitting on the floor and chair and   hurts with rolling to get out of bed. Sleeps on her side with body pillow with her leg over her pillow. Pain 7-8/10  Goal status: INITIAL   Pia Lupe Plump, PT 08/02/2024, 8:53 AM

## 2024-08-02 NOTE — Patient Instructions (Addendum)
 Lengthen Back rib by L  shoulder ( winging)  Lie on R  side , pillow between knees and under head  Pull  L arm overhead over mattress, grab the edge of mattress,pull it upward, drawing elbow away from ears  Breathing 10 reps Brushing arm with 3/4 turn onto pillow behind back  Lying on R  side ,Pillow/ Block between knees  dragging top forearm across ribs below breast rotating 3/4 turn,  rotating  _L_ only this week ,  relax onto the pillow behind the back  and then back to other palm , maintain top palm on body whole top and not lift shoulder Do this side this week       Wait do both sides until we have levelled out your spine and shoulders ___ Lengthen Back rib by R  shoulder   ( winging)  Lie on L  side , pillow between knees and under head  Pull  R arm overhead over mattress, grab the edge of mattress,pull it upward, drawing elbow away from ears  Breathing 10 reps Brushing arm with 3/4 turn onto pillow behind back  Lying on L  side ,Pillow/ Block between knees  dragging top forearm across ribs below breast rotating 3/4 turn,  rotating  _R_ only this week ,  relax onto the pillow behind the back  and then back to other palm , maintain top palm on body whole top and not lift shoulder Do this side this week       Wait do both sides until we have levelled out your spine    ____________  ZigZag stretch  Reclined twist for hips and side of the hips/ legs  Lay on your back, knees bend Scoot hips to the R , leave shoulders in place Wobble knees to the L side 45 deg and to midline  10 reps   ___  Complete food chart to present to GI doctor Make time to slow down and relax before and after eating to optimize digestion To not multitask while eating to help absorb nutrients and be less anxious which will help with digestion and minimize loose stool    Colon massage R low ab to top R upper ab to L upper ab to L low ab  3 cycles  Before eating and after as a practice for calming  and overcoming anxiety and to physically help with physiology of digestion  and better bowels , relax pelvic floor and to help with complete emptying of urine

## 2024-08-06 DIAGNOSIS — R1032 Left lower quadrant pain: Secondary | ICD-10-CM | POA: Diagnosis not present

## 2024-08-06 DIAGNOSIS — R198 Other specified symptoms and signs involving the digestive system and abdomen: Secondary | ICD-10-CM | POA: Diagnosis not present

## 2024-08-06 DIAGNOSIS — R1031 Right lower quadrant pain: Secondary | ICD-10-CM | POA: Diagnosis not present

## 2024-08-12 ENCOUNTER — Encounter: Admitting: Physical Therapy

## 2024-08-16 ENCOUNTER — Ambulatory Visit: Admitting: Physical Therapy

## 2024-08-16 DIAGNOSIS — R293 Abnormal posture: Secondary | ICD-10-CM | POA: Diagnosis not present

## 2024-08-16 DIAGNOSIS — M5459 Other low back pain: Secondary | ICD-10-CM

## 2024-08-16 DIAGNOSIS — M533 Sacrococcygeal disorders, not elsewhere classified: Secondary | ICD-10-CM | POA: Diagnosis not present

## 2024-08-16 NOTE — Patient Instructions (Addendum)
 Signed  Stretches :    Neck / shoulder stretches:     Lying on back - small sushi roll towel under neck  _ 6 directions    Chin up, down Rotation like changing lanes when driving Ear to shoulder like puppy dog    10 reps      _angel wings, lower elbows down , keep arms touching bed  10 reps  ____________ Lengthen Back rib by L  shoulder ( winging)  Lie on R  side , pillow between knees and under head  Pull  L arm overhead over mattress, grab the edge of mattress,pull it upward, drawing elbow away from ears  Breathing 10 reps Brushing arm with 3/4 turn onto pillow behind back  Lying on R  side ,Pillow/ Block between knees  dragging top forearm across ribs below breast rotating 3/4 turn,  rotating  _L_ only this week ,  relax onto the pillow behind the back  and then back to other palm , maintain top palm on body whole top and not lift shoulder Do this side this week      do both sides until we have levelled out your spine and shoulders ___     ____________   ZigZag stretch   Reclined twist for hips and side of the hips/ legs   Lay on your back, knees bend Scoot hips to the R , leave shoulders in place Wobble knees to the L side 45 deg and to midline  10 reps          Motion is lotion  - good morning and evening     On your side - winging and brushing   Pillow between knees and behind back     On your back with towel roll under the neck  - neck 6 directions - angel wings - ( dragging arms)  - ZigZag stretch scoot hips to one side and rock knees to opposite, arms by side ,palms up      On your other side - winging and brushing   Pillow between knees and behind back                          Body scan technique for relaxation

## 2024-08-16 NOTE — Therapy (Unsigned)
 OUTPATIENT PHYSICAL THERAPY TREATMENT    Patient Name: Shannon Madden MRN: [id number] DOB:[birth year], 28 y.o., female Today's Date: 08/16/2024   PT End of Session - 08/16/24 1158     Visit Number 3    Number of Visits 10    Date for Recertification  10/04/24    PT Start Time 1150    PT Stop Time 1230    PT Time Calculation (min) 40 min    Activity Tolerance Patient tolerated treatment well;No increased pain    Behavior During Therapy WFL for tasks assessed/performed          Past Medical History:  Diagnosis Date   Medical history non-contributory    No past surgical history on file. Patient Active Problem List   Diagnosis Date Noted   Sepsis (HCC) 05/20/2017   Labor and delivery, indication for care 05/06/2017   Indication for care in labor and delivery, antepartum 05/05/2017   Echogenic intracardiac focus of fetus on prenatal ultrasound 01/30/2017   Pelviectasis, renal 01/30/2017   MDD (major depressive disorder), single episode, severe (HCC) 12/24/2013    PCP:  Gaetana Haddock, PA-C   REFERRING PROVIDER: Edsel Blush CNM   REFERRING DIAG:  R10.32 (ICD-10-CM) - Left lower quadrant pain R10.2 (ICD-10-CM) - Pelvic and perineal pain N94.10 (ICD-10-CM) - Unspecified dyspareunia  Rationale for Evaluation and Treatment Rehabilitation  THERAPY DIAG:  Sacrococcygeal disorders, not elsewhere classified  Other low back pain  Abnormal posture  ONSET DATE:   SUBJECTIVE:  SUBJECTIVE STATEMENT Today:  Pt has noticed no more leakage after sitting on toilet and standing up since starting PT   Pt saw her GI doctor and presented to her her food chart. Pt was recommended info about food and was asked to provide stool sample and to undergo breath test                                                                                                                                                         SUBJECTIVE STATEMENT ON EVAL  07/26/24  1)  L pelvic pain:  located  in the L groin area. It started after childbirth 7 years. Pt started getting health insurance 2 years ago and her doctor ordered imaging.  There was no findings for the cause of her pain. Pt has had ruptured cysts in the past and there are still some small ones on her L ovary.  Her Certified midwifed referred her to Pelvic PT to further help with pain . Pain occurs with sexual intercourse during deep penetration. It feels like pulsating feeling on her L side. Worst with all fours position .  Pain 10/10.  Pt avoids certain positions now.  Pain also comes and goes during random times during any position not related to sexual intercourse. Pt noticed pain happens when she gets  up from the floor because she sits on the floor occur and during sit to stand  Pt also feels pelvic pain  and LBP when she gets up from the floor, sit to stand, and on the floor with autistic son.  She also has to  change diapers , pick up and carry sometimes her son who weights 60 lbs  .    2) dribbling of urine after urination when getting up to stand:  occurs more recently in the past year. It occurs 75% of the time with urination .  Denied lowered pelvic organ / pressure sensation.    3)  B LBP - upper and lower, non radiating.  : Get up from sitting on the floor and chair and   hurts with rolling to get out of bed. Sleeps on her side with body pillow with her leg over her pillow. Pain 7-8/10     PERTINENT HISTORY:    PAIN:  Are you having pain? Yes: see above   PRECAUTIONS: No   WEIGHT BEARING RESTRICTIONS: No  FALLS:  Has patient fallen in last 6 months? 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     Anticipate these  structural improvements will help promote optimize IAP system for improved pelvic floor function, trunk stability, gait, balance, stabilization with mobility tasks.  Plan to address pelvic floor issues once pelvis and spine are realigned to yield better outcomes.                                                       Pt benefits from skilled PT.    OBJECTIVE IMPAIRMENTS decreased activity tolerance, decreased coordination, decreased endurance, decreased mobility, difficulty walking, decreased ROM, decreased strength, decreased safety awareness, hypomobility, increased muscle spasms, impaired flexibility, improper body mechanics, postural dysfunction, and pain   ACTIVITY LIMITATIONS  self-care,  sleep, home chores, work tasks    PARTICIPATION LIMITATIONS:  community   PERSONAL FACTORS   affecting patient's functional outcome:    REHAB POTENTIAL: Good   CLINICAL DECISION MAKING: Evolving/moderate complexity   EVALUATION COMPLEXITY: Moderate    PATIENT EDUCATION:    Education details: Showed pt anatomy images. Explained muscles attachments/ connection, physiology of deep core system/ spinal- thoracic-pelvis-lower kinetic chain as they relate to pt's presentation, Sx, and past Hx. Explained what and how these areas of deficits need to be restored to balance and function    See Therapeutic activity / neuromuscular re-education section  Answered pt's questions.   Person educated: Patient Education method: Explanation, Demonstration, Tactile cues, Verbal cues, and Handouts Education comprehension: verbalized understanding, returned  demonstration, verbal  cues required, tactile cues required, and needs further education     PLAN: PT FREQUENCY: 1x/week   PT DURATION: 10 weeks   PLANNED INTERVENTIONS:   Gait training;Stair training;Functional mobility training;DME Instruction;Therapeutic activities;Therapeutic exercise;Balance training;Neuromuscular re-education;Patient/family education;Vestibular;Visual/perceptual remediation/compensation;Passive range of motion;Moist Heat;Cryotherapy;Traction;Canalith Repostioning;Joint Manipulations;Manual lymph drainage;Manual techniques;Scar mobilization;Energy conservation;Dry needling;ADLs/Self Care Home Management;Biofeedback;Electrical Stimulation;Taping    PLAN FOR NEXT SESSION: See clinical impression for plan     GOALS: Goals reviewed with patient? Yes  SHORT TERM GOALS: Target date: 08/23/2024    Pt will demo IND with HEP                    Baseline: Not IND            Goal status: INITIAL   LONG TERM GOALS: Target date: 10/04/2024    1.Pt will demo proper deep core coordination without chest breathing and optimal excursion of diaphragm/pelvic floor in order to promote spinal stability and pelvic floor function  Baseline: dyscoordination Goal status: INITIAL  2.  Pt will demo proper body mechanics in against gravity tasks and ADLs  work tasks, fitness  to minimize straining pelvic floor / back    Baseline: not IND, improper form that places strain on pelvic floor  Goal status: INITIAL    3. Pt will demo increased gait speed > 1.3 m/s with reciprocal gait pattern, longer stride length  in order to ambulate safely in community and return to fitness routine  Baseline: 1.18 m/s, limited posterior rotation of R rib,  R hip hike  Goal status: INITIAL    4. Pt will demo levelled pelvic girdle and shoulder height in order to progress to deep core strengthening HEP and restore mobility at spine, pelvis, gait, posture minimize falls, and improve balance  Baseline:   SI assessment  R shoulder/ R pelvic lowered    Goal status: INITIAL   5. Pt will improve PFDI-7 questionnaire to  pts  score change  to demo improved QOL  Baseline:    ( greater pts indicate greater negative impact on QOL)   68   pts  ( total)  10   pts  ( UIQ-7 )   11 pts  ( CRAIQ-7 )    48  pts  ( POPIQ-7 )  Goal status: INITIAL  6. Pt will demo proper body mechanics with stand<> floor, sit <> floor, and with sitting on floor , and picking up son from  floor or hold him and report no pelvic pain during these transfers in order to home school and change diapers for autistic    64 yr old son who weights 60 lbs  .  Baseline:  Pt noticed pain happens when she gets up from the floor because she sits on the floor occur and during sit to stand  Goal status: INITIAL  7.  Pt will report post urine dribble occurring < 50% of the time  Baseline: dribbling of urine after urination when getting up to stand:  occurs more recently in the past year. It occurs 75% of the time with urination .  Goal status: INITIAL  8. Pt will demo improved alignment with t/f from floor to stand, sit to stand and rolling out of bed and report decreased LBP < 2/10 Baseline::Get up from sitting on the floor and chair and   hurts with rolling to get out of bed. Sleeps on her side with body pillow with her leg over her pillow. Pain 7-8/10  Goal status:  INITIAL   Pia Lupe Plump, PT 08/16/2024, 11:58 AM

## 2024-08-20 DIAGNOSIS — R198 Other specified symptoms and signs involving the digestive system and abdomen: Secondary | ICD-10-CM | POA: Diagnosis not present

## 2024-08-20 DIAGNOSIS — R1032 Left lower quadrant pain: Secondary | ICD-10-CM | POA: Diagnosis not present

## 2024-08-20 DIAGNOSIS — R1031 Right lower quadrant pain: Secondary | ICD-10-CM | POA: Diagnosis not present

## 2024-08-23 ENCOUNTER — Encounter: Admitting: Physical Therapy

## 2024-08-26 DIAGNOSIS — R197 Diarrhea, unspecified: Secondary | ICD-10-CM | POA: Diagnosis not present

## 2024-08-30 ENCOUNTER — Ambulatory Visit: Attending: Certified Nurse Midwife | Admitting: Physical Therapy

## 2024-08-30 DIAGNOSIS — M5459 Other low back pain: Secondary | ICD-10-CM | POA: Insufficient documentation

## 2024-08-30 DIAGNOSIS — R293 Abnormal posture: Secondary | ICD-10-CM | POA: Insufficient documentation

## 2024-08-30 DIAGNOSIS — M533 Sacrococcygeal disorders, not elsewhere classified: Secondary | ICD-10-CM | POA: Insufficient documentation

## 2024-08-30 NOTE — Therapy (Unsigned)
 OUTPATIENT PHYSICAL THERAPY TREATMENT    Patient Name: Shannon Madden MRN: [id number] DOB:Jul 09, 1996, 28 y.o., female Today's Date: 08/30/2024   PT End of Session - 08/30/24 1153     Visit Number 4    Number of Visits 10    Date for Recertification  10/04/24    PT Start Time 1150    PT Stop Time 1230    PT Time Calculation (min) 40 min    Activity Tolerance Patient tolerated treatment well;No increased pain    Behavior During Therapy WFL for tasks assessed/performed          Past Medical History:  Diagnosis Date   Medical history non-contributory    No past surgical history on file. Patient Active Problem List   Diagnosis Date Noted   Sepsis (HCC) 05/20/2017   Labor and delivery, indication for care 05/06/2017   Indication for care in labor and delivery, antepartum 05/05/2017   Echogenic intracardiac focus of fetus on prenatal ultrasound 01/30/2017   Pelviectasis, renal 01/30/2017   MDD (major depressive disorder), single episode, severe (HCC) 12/24/2013    PCP:  Gaetana Haddock, PA-C   REFERRING PROVIDER: Edsel Blush CNM   REFERRING DIAG:  R10.32 (ICD-10-CM) - Left lower quadrant pain R10.2 (ICD-10-CM) - Pelvic and perineal pain N94.10 (ICD-10-CM) - Unspecified dyspareunia  Rationale for Evaluation and Treatment Rehabilitation  THERAPY DIAG:  Sacrococcygeal disorders, not elsewhere classified  Other low back pain  Abnormal posture  ONSET DATE:   SUBJECTIVE:  SUBJECTIVE STATEMENT Today:  Pt has connected nutritionist  and waiting to hear back about insurance coverage       Pt had more mid/left pelvic pain last week lasting 2 hours.  Low back pain has resolved but pain has moved up to mid and upper back                                                                                                                                           SUBJECTIVE STATEMENT ON EVAL  07/26/24  1)  L pelvic pain:  located in the L groin area. It started after  childbirth 7 years. Pt started getting health insurance 2 years ago and her doctor ordered imaging.  There was no findings for the cause of her pain. Pt has had ruptured cysts in the past and there are still some small ones on her L ovary.  Her Certified midwifed referred her to Pelvic PT to further help with pain . Pain occurs with sexual intercourse during deep penetration. It feels like pulsating feeling on her L side. Worst with all fours position .  Pain 10/10.  Pt avoids certain positions now.  Pain also comes and goes during random times during any position not related to sexual intercourse. Pt noticed pain happens when she gets up from the floor because she sits on the floor occur and during sit to stand  Pt also feels pelvic pain  and LBP when she gets up from the floor, sit to stand, and on the floor with autistic son.  She also has to  change diapers , pick up and carry sometimes her son who weights 60 lbs  .    2) dribbling of urine after urination when getting up to stand:  occurs more recently in the past year. It occurs 75% of the time with urination .  Denied lowered pelvic organ / pressure sensation.    3)  B LBP - upper and lower, non radiating.  : Get up from sitting on the floor and chair and   hurts with rolling to get out of bed. Sleeps on her side with body pillow with her leg over her pillow. Pain 7-8/10     PERTINENT HISTORY:    PAIN:  Are you having pain? Yes: see above   PRECAUTIONS: No   WEIGHT BEARING RESTRICTIONS: No  FALLS:  Has patient fallen in last 6 months? 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 Progressed to manual Tx to assess L cervicothoracic area which was tight and hypomobile.   Plan to return to resistance band next session as pt showed improved AROM of C/T and shoulder motion post Tx .  Plan to progress to cervicoscapular strenghtening and low ab strengthening with resistance band next week  Anticipate these  structural improvements will help promote optimize IAP system for improved pelvic floor function, trunk stability, gait, balance, stabilization with mobility tasks.  Plan to address pelvic floor issues once pelvis and spine are realigned to yield better outcomes.                                                       Pt benefits from skilled PT.    OBJECTIVE IMPAIRMENTS decreased activity tolerance, decreased coordination, decreased endurance, decreased mobility, difficulty walking, decreased ROM, decreased strength, decreased safety awareness, hypomobility, increased muscle spasms, impaired flexibility, improper body mechanics, postural dysfunction, and pain   ACTIVITY LIMITATIONS  self-care,  sleep, home chores, work tasks    PARTICIPATION LIMITATIONS:  community   PERSONAL FACTORS   affecting patient's functional outcome:    REHAB POTENTIAL: Good   CLINICAL DECISION MAKING: Evolving/moderate complexity   EVALUATION COMPLEXITY: Moderate    PATIENT EDUCATION:    Education details: Showed pt anatomy images. Explained muscles attachments/ connection, physiology of deep core system/ spinal- thoracic-pelvis-lower kinetic chain as they relate to pt's presentation, Sx, and past Hx. Explained what and how these areas of deficits need to be restored to balance and function    See  Therapeutic activity / neuromuscular re-education section  Answered pt's questions.   Person educated: Patient Education method: Explanation, Demonstration, Tactile cues, Verbal cues, and Handouts Education comprehension: verbalized understanding, returned demonstration, verbal cues required, tactile cues required, and needs further education     PLAN: PT FREQUENCY: 1x/week   PT DURATION: 10 weeks   PLANNED INTERVENTIONS:   Gait training;Stair training;Functional mobility training;DME Instruction;Therapeutic activities;Therapeutic exercise;Balance training;Neuromuscular re-education;Patient/family education;Vestibular;Visual/perceptual remediation/compensation;Passive range of motion;Moist Heat;Cryotherapy;Traction;Canalith Repostioning;Joint Manipulations;Manual lymph drainage;Manual techniques;Scar mobilization;Energy conservation;Dry needling;ADLs/Self Care Home Management;Biofeedback;Electrical Stimulation;Taping    PLAN FOR NEXT SESSION: See clinical impression for plan     GOALS: Goals reviewed with patient? Yes  SHORT TERM GOALS: Target date: 08/23/2024    Pt will demo IND with HEP                    Baseline: Not IND            Goal status: INITIAL   LONG TERM GOALS: Target date: 10/04/2024    1.Pt will demo proper deep core coordination without chest breathing and optimal excursion of diaphragm/pelvic floor in order to promote spinal stability and pelvic floor function  Baseline: dyscoordination Goal status: INITIAL  2.  Pt will demo proper body mechanics in against gravity tasks and ADLs  work tasks, fitness  to minimize straining pelvic floor / back    Baseline: not IND, improper form that places strain on pelvic floor  Goal status: INITIAL    3. Pt will demo increased gait speed > 1.3 m/s with reciprocal gait pattern, longer stride length  in order to ambulate safely in community and return to fitness routine  Baseline: 1.18 m/s, limited posterior rotation  of R rib,  R hip hike  Goal status: INITIAL    4. Pt will demo levelled pelvic girdle and shoulder height in order to progress to deep core strengthening HEP and restore mobility at spine, pelvis, gait, posture minimize falls, and improve balance  Baseline:  SI assessment  R shoulder/ R pelvic lowered    Goal status: INITIAL   5. Pt will improve PFDI-7 questionnaire to  pts  score change  to demo improved QOL  Baseline:    ( greater pts indicate greater negative impact on QOL)   68   pts  ( total)  10   pts  ( UIQ-7 )   11 pts  ( CRAIQ-7 )    48  pts  ( POPIQ-7 )  Goal status: INITIAL  6. Pt will demo proper body mechanics with stand<> floor, sit <> floor, and with sitting on floor , and picking up son from  floor or hold him and report no pelvic pain during these transfers in order to home school and change diapers for autistic    27 yr old son who weights 60 lbs  .  Baseline:  Pt noticed pain happens when she gets up from the floor because she sits on the floor occur and during sit to stand  Goal status: INITIAL  7.  Pt will report post urine dribble occurring < 50% of the time  Baseline: dribbling of urine after urination when getting up to stand:  occurs more recently in the past year. It occurs 75% of the time with urination .  Goal status: INITIAL  8. Pt will demo improved alignment with t/f from floor to stand, sit to stand and rolling out of bed and report decreased LBP < 2/10 Baseline::Get up from sitting on the  floor and chair and   hurts with rolling to get out of bed. Sleeps on her side with body pillow with her leg over her pillow. Pain 7-8/10  Goal status: INITIAL   Pia Lupe Plump, PT 08/30/2024, 11:56 AM

## 2024-08-30 NOTE — Patient Instructions (Addendum)
 Stretches :     Biomedical engineer on kitchen counter,   Palms shoulder width apart  Minisquat postion Trunk is parallel to floor  A) Pull buttocks back to lengthen spine, knees bent  3 breaths   B) Bring R hand to the L, and stretch the R side trunk  3 breaths   Brings hands to center again Do the same to the L side stretch by placing L hand on top of R    C) L hand on L hip, rotate rib and look up at the ceiling L  ( for this week to realign spine)    Later, you can do both sides  ____   Stretches :   Neck / shoulder stretches:    Lying on back - small sushi roll towel under neck  _ 6 directions   Chin up, down Rotation like changing lanes when driving Ear to shoulder like puppy dog   10 reps    _angel wings, lower elbows down , keep arms touching bed  10 reps      ___

## 2024-09-06 ENCOUNTER — Encounter: Admitting: Physical Therapy

## 2024-09-13 ENCOUNTER — Ambulatory Visit: Admitting: Physical Therapy

## 2024-09-15 DIAGNOSIS — X58XXXA Exposure to other specified factors, initial encounter: Secondary | ICD-10-CM | POA: Diagnosis not present

## 2024-09-15 DIAGNOSIS — S46811A Strain of other muscles, fascia and tendons at shoulder and upper arm level, right arm, initial encounter: Secondary | ICD-10-CM | POA: Diagnosis not present

## 2024-09-15 DIAGNOSIS — S46911A Strain of unspecified muscle, fascia and tendon at shoulder and upper arm level, right arm, initial encounter: Secondary | ICD-10-CM | POA: Diagnosis not present

## 2024-09-15 DIAGNOSIS — M4312 Spondylolisthesis, cervical region: Secondary | ICD-10-CM | POA: Diagnosis not present

## 2024-09-20 ENCOUNTER — Ambulatory Visit: Attending: Certified Nurse Midwife | Admitting: Physical Therapy

## 2024-09-20 DIAGNOSIS — M5459 Other low back pain: Secondary | ICD-10-CM | POA: Diagnosis not present

## 2024-09-20 DIAGNOSIS — R293 Abnormal posture: Secondary | ICD-10-CM | POA: Diagnosis not present

## 2024-09-20 DIAGNOSIS — M533 Sacrococcygeal disorders, not elsewhere classified: Secondary | ICD-10-CM | POA: Insufficient documentation

## 2024-09-20 NOTE — Patient Instructions (Signed)
 Stretches : (Cuing provided for proper alignment)  Instructions start with Strap on R   Stretches for your legs: LAYING on Back Use upper arms and elbows for stability when pulling strap from behind the back of the thigh  Opposite knee bent and foot firm in align with hip   Inhale, exhale, pull knee towards chest to lengthen back    __   Deep core level 1-2 ( handout) daily

## 2024-09-20 NOTE — Therapy (Signed)
 OUTPATIENT PHYSICAL THERAPY TREATMENT    Patient Name: Shannon Madden MRN: [id number] DOB:11/14/1996, 28 y.o., female Today's Date: 09/20/2024   PT End of Session - 09/20/24 1154     Visit Number 5    Number of Visits 10    Date for Recertification  10/04/24    PT Start Time 1150    PT Stop Time 1230    PT Time Calculation (min) 40 min    Activity Tolerance Patient tolerated treatment well;No increased pain    Behavior During Therapy WFL for tasks assessed/performed          Past Medical History:  Diagnosis Date   Medical history non-contributory    No past surgical history on file. Patient Active Problem List   Diagnosis Date Noted   Sepsis (HCC) 05/20/2017   Labor and delivery, indication for care 05/06/2017   Indication for care in labor and delivery, antepartum 05/05/2017   Echogenic intracardiac focus of fetus on prenatal ultrasound 01/30/2017   Pelviectasis, renal 01/30/2017   MDD (major depressive disorder), single episode, severe (HCC) 12/24/2013    PCP:  Gaetana Haddock, PA-C   REFERRING PROVIDER: Edsel Blush CNM   REFERRING DIAG:  R10.32 (ICD-10-CM) - Left lower quadrant pain R10.2 (ICD-10-CM) - Pelvic and perineal pain N94.10 (ICD-10-CM) - Unspecified dyspareunia  Rationale for Evaluation and Treatment Rehabilitation  THERAPY DIAG:  Other low back pain  Abnormal posture  Sacrococcygeal disorders, not elsewhere classified  ONSET DATE:   SUBJECTIVE:  SUBJECTIVE STATEMENT Today:  Pt is not straining with bowel movements and using blocks for elevating feet  pt is incorporating more fiber   Pt had R shoulder. Neck radiating pain with numbness that came on suddenly while she was watching TV last week. Pt went to the Urgent Care and there was not significant findings. The Sx has resolved after a few days.   Dribbling after urination has decreased and  happening not as often                                                                                                                                            SUBJECTIVE STATEMENT ON EVAL  07/26/24  1)  L pelvic pain:  located in the L groin area. It started after childbirth 7 years. Pt started getting health insurance 2 years ago and her doctor ordered imaging.  There was no findings for the cause of her pain. Pt has had ruptured cysts in the past and there are still some small ones on her L ovary.  Her Certified midwifed referred her to Pelvic PT to further help with pain . Pain occurs with sexual intercourse during deep penetration. It feels like pulsating feeling on her L side. Worst with all fours position .  Pain 10/10.  Pt avoids certain positions now.  Pain also comes and goes during random times during any position  not related to sexual intercourse. Pt noticed pain happens when she gets up from the floor because she sits on the floor occur and during sit to stand  Pt also feels pelvic pain  and LBP when she gets up from the floor, sit to stand, and on the floor with autistic son.  She also has to  change diapers , pick up and carry sometimes her son who weights 60 lbs  .    2) dribbling of urine after urination when getting up to stand:  occurs more recently in the past year. It occurs 75% of the time with urination .  Denied lowered pelvic organ / pressure sensation.    3)  B LBP - upper and lower, non radiating.  : Get up from sitting on the floor and chair and   hurts with rolling to get out of bed. Sleeps on her side with body pillow with her leg over her pillow. Pain 7-8/10     PERTINENT HISTORY:    PAIN:  Are you having pain? Yes: see above   PRECAUTIONS: No   WEIGHT BEARING RESTRICTIONS: No  FALLS:  Has patient fallen in last 6 months? 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   PLOF: Independent  PATIENT GOALS:   Alleviate L pelvic  pain and LBP and to not have dribbling after urination upon standing     OBJECTIVE:     [facility] PT Assessment - 09/20/24 1238       Observation/Other Assessments   Observations more upright posture,      Coordination   Coordination and Movement Description poor propioception of anterior tilt and LKC with optimal pelvic floor lengthening during deep core training, poor awareness of anterior tilt of pelvis      Palpation   SI assessment  levelled shoudlers and pelvis          [facility] Adult PT Treatment/Exercise - 09/20/24 1236       Self-Care   Other Self-Care Comments  explained LKC and pelvic propioception for deep core function and helping LBP, GI system, and urinary and bowel function. Active listening to pt's past visit to Urgent Care for R shoulder and bneck Sx.  Withheld resistance band progression due to this past event and will wait another week before adding back resistance sband strenghtening.      Neuro Re-ed    Neuro Re-ed Details  propioception cues for paraspinal back stretch to minimzie tightness in deep core training, propioceptin cues and coordination cues for deep core training and to build awareness of LKC helping relax pelvic floor      Manual Therapy   Manual therapy comments provided tactile cues at pelvic floor externally through clothing with LKC cues to promote pelvic floor lengthening during deep core training,                 HOME EXERCISE PROGRAM: See pt instruction section    ASSESSMENT:  CLINICAL IMPRESSION:  Improvements: Pt has noticed no more leakage after sitting on toilet and standing up since starting PT LBP has resolved   Progressed to deep core training today as pt demonstrated good carry over with more upright posture, levelled pelvis and shoulder alignment.  Provided propioception cues for paraspinal back stretch to minimzie tightness in deep core training, propioceptin cues and coordination cues for deep  core training and to  build awareness of LKC helping relax pelvic floor  Explained LKC and pelvic propioception for deep core function and helping LBP, GI system, and urinary and bowel function. Active listening to pt's past visit to Urgent Care for R shoulder and bneck Sx.  Withheld resistance band progression due to this past event and will wait another week before adding back resistance sband strenghtening.    Plan to return to resistance band next session .  Plan to progress to cervicoscapular strenghtening and low ab strengthening with resistance band next week  Anticipate these  structural improvements will help promote optimize IAP system for improved pelvic floor function, trunk stability, gait, balance, stabilization with mobility tasks.  Plan to address pelvic floor issues once pelvis and spine are realigned to yield better outcomes.                                                       Pt benefits from skilled PT.    OBJECTIVE IMPAIRMENTS decreased activity tolerance, decreased coordination, decreased endurance, decreased mobility, difficulty walking, decreased ROM, decreased strength, decreased safety awareness, hypomobility, increased muscle spasms, impaired flexibility, improper body mechanics, postural dysfunction, and pain   ACTIVITY LIMITATIONS  self-care,  sleep, home chores, work tasks    PARTICIPATION LIMITATIONS:  community   PERSONAL FACTORS   affecting patient's functional outcome:    REHAB POTENTIAL: Good   CLINICAL DECISION MAKING: Evolving/moderate complexity   EVALUATION COMPLEXITY: Moderate    PATIENT EDUCATION:    Education details: Showed pt anatomy images. Explained muscles attachments/ connection, physiology of deep core system/ spinal- thoracic-pelvis-lower kinetic chain as they relate to pt's presentation, Sx, and past Hx. Explained what and how these areas of deficits need to be restored to balance and function    See Therapeutic activity / neuromuscular re-education  section  Answered pt's questions.   Person educated: Patient Education method: Explanation, Demonstration, Tactile cues, Verbal cues, and Handouts Education comprehension: verbalized understanding, returned demonstration, verbal cues required, tactile cues required, and needs further education     PLAN: PT FREQUENCY: 1x/week   PT DURATION: 10 weeks   PLANNED INTERVENTIONS:   Gait training;Stair training;Functional mobility training;DME Instruction;Therapeutic activities;Therapeutic exercise;Balance training;Neuromuscular re-education;Patient/family education;Vestibular;Visual/perceptual remediation/compensation;Passive range of motion;Moist Heat;Cryotherapy;Traction;Canalith Repostioning;Joint Manipulations;Manual lymph drainage;Manual techniques;Scar mobilization;Energy conservation;Dry needling;ADLs/Self Care Home Management;Biofeedback;Electrical Stimulation;Taping    PLAN FOR NEXT SESSION: See clinical impression for plan     GOALS: Goals reviewed with patient? Yes  SHORT TERM GOALS: Target date: 08/23/2024    Pt will demo IND with HEP                    Baseline: Not IND            Goal status: INITIAL   LONG TERM GOALS: Target date: 10/04/2024    1.Pt will demo proper deep core coordination without chest breathing and optimal excursion of diaphragm/pelvic floor in order to promote spinal stability and pelvic floor function  Baseline: dyscoordination Goal status: INITIAL  2.  Pt will demo proper body mechanics in against gravity tasks and ADLs  work tasks, fitness  to minimize straining pelvic floor / back    Baseline: not IND, improper form that places strain on pelvic floor  Goal status: INITIAL    3. Pt will demo increased gait speed >  1.3 m/s with reciprocal gait pattern, longer stride length  in order to ambulate safely in community and return to fitness routine  Baseline: 1.18 m/s, limited posterior rotation of R rib,  R hip hike  Goal status:  INITIAL    4. Pt will demo levelled pelvic girdle and shoulder height in order to progress to deep core strengthening HEP and restore mobility at spine, pelvis, gait, posture minimize falls, and improve balance  Baseline:  SI assessment  R shoulder/ R pelvic lowered    Goal status: INITIAL   5. Pt will improve PFDI-7 questionnaire to  pts  score change  to demo improved QOL  Baseline:    ( greater pts indicate greater negative impact on QOL)   68   pts  ( total)  10   pts  ( UIQ-7 )   11 pts  ( CRAIQ-7 )    48  pts  ( POPIQ-7 )  Goal status: INITIAL  6. Pt will demo proper body mechanics with stand<> floor, sit <> floor, and with sitting on floor , and picking up son from  floor or hold him and report no pelvic pain during these transfers in order to home school and change diapers for autistic    26 yr old son who weights 60 lbs  .  Baseline:  Pt noticed pain happens when she gets up from the floor because she sits on the floor occur and during sit to stand  Goal status: INITIAL  7.  Pt will report post urine dribble occurring < 50% of the time  Baseline: dribbling of urine after urination when getting up to stand:  occurs more recently in the past year. It occurs 75% of the time with urination .  Goal status: INITIAL  8. Pt will demo improved alignment with t/f from floor to stand, sit to stand and rolling out of bed and report decreased LBP < 2/10 Baseline::Get up from sitting on the floor and chair and   hurts with rolling to get out of bed. Sleeps on her side with body pillow with her leg over her pillow. Pain 7-8/10  Goal status: INITIAL   Pia Lupe Plump, PT 09/20/2024, 11:55 AM

## 2024-09-27 ENCOUNTER — Encounter: Admitting: Physical Therapy

## 2024-09-29 ENCOUNTER — Encounter: Admitting: Physical Therapy

## 2024-10-04 ENCOUNTER — Ambulatory Visit: Admitting: Physical Therapy

## 2024-10-11 ENCOUNTER — Ambulatory Visit: Admitting: Physical Therapy

## 2024-10-11 DIAGNOSIS — M5459 Other low back pain: Secondary | ICD-10-CM | POA: Diagnosis not present

## 2024-10-11 DIAGNOSIS — M533 Sacrococcygeal disorders, not elsewhere classified: Secondary | ICD-10-CM | POA: Diagnosis not present

## 2024-10-11 DIAGNOSIS — R293 Abnormal posture: Secondary | ICD-10-CM | POA: Diagnosis not present

## 2024-10-11 NOTE — Therapy (Signed)
 OUTPATIENT PHYSICAL THERAPY TREATMENT    Patient Name: Shannon Madden MRN: [id number] DOB:Sep 05, 1996, 28 y.o., female Today's Date: 10/11/2024   PT End of Session - 10/11/24 1157     Visit Number 6    Number of Visits 10    Date for Recertification  10/04/24    PT Start Time 1150    PT Stop Time 1230    PT Time Calculation (min) 40 min    Activity Tolerance Patient tolerated treatment well;No increased pain    Behavior During Therapy WFL for tasks assessed/performed          Past Medical History:  Diagnosis Date   Medical history non-contributory    No past surgical history on file. Patient Active Problem List   Diagnosis Date Noted   Sepsis (HCC) 05/20/2017   Labor and delivery, indication for care 05/06/2017   Indication for care in labor and delivery, antepartum 05/05/2017   Echogenic intracardiac focus of fetus on prenatal ultrasound 01/30/2017   Pelviectasis, renal 01/30/2017   MDD (major depressive disorder), single episode, severe (HCC) 12/24/2013    PCP:  Gaetana Haddock, PA-C   REFERRING PROVIDER: Edsel Blush CNM   REFERRING DIAG:  R10.32 (ICD-10-CM) - Left lower quadrant pain R10.2 (ICD-10-CM) - Pelvic and perineal pain N94.10 (ICD-10-CM) - Unspecified dyspareunia  Rationale for Evaluation and Treatment Rehabilitation  THERAPY DIAG:  Other low back pain  Abnormal posture  Sacrococcygeal disorders, not elsewhere classified  ONSET DATE:   SUBJECTIVE:  SUBJECTIVE STATEMENT Today:  Pt  reports no more LBP,  only tightness along L midback.                                                                                                               All exercises are helping her feel better around pelvis and back.   Next session is her last due to insurance changes                             SUBJECTIVE STATEMENT ON EVAL  07/26/24  1)  L pelvic pain:  located in the L groin area. It started after childbirth 7 years. Pt started getting health  insurance 2 years ago and her doctor ordered imaging.  There was no findings for the cause of her pain. Pt has had ruptured cysts in the past and there are still some small ones on her L ovary.  Her Certified midwifed referred her to Pelvic PT to further help with pain . Pain occurs with sexual intercourse during deep penetration. It feels like pulsating feeling on her L side. Worst with all fours position .  Pain 10/10.  Pt avoids certain positions now.  Pain also comes and goes during random times during any position not related to sexual intercourse. Pt noticed pain happens when she gets up from the floor because she sits on the floor occur and during sit to stand  Pt also feels pelvic pain  and LBP when she gets  up from the floor, sit to stand, and on the floor with autistic son.  She also has to  change diapers , pick up and carry sometimes her son who weights 60 lbs  .    2) dribbling of urine after urination when getting up to stand:  occurs more recently in the past year. It occurs 75% of the time with urination .  Denied lowered pelvic organ / pressure sensation.    3)  B LBP - upper and lower, non radiating.  : Get up from sitting on the floor and chair and   hurts with rolling to get out of bed. Sleeps on her side with body pillow with her leg over her pillow. Pain 7-8/10     PERTINENT HISTORY:    PAIN:  Are you having pain? Yes: see above   PRECAUTIONS: No   WEIGHT BEARING RESTRICTIONS: No  FALLS:  Has patient fallen in last 6 months? 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   PLOF: Independent  PATIENT GOALS:   Alleviate L pelvic pain and LBP and to not have dribbling after urination upon standing     OBJECTIVE:     [facility] PT Assessment - 10/11/24 1200       Coordination   Coordination and Movement Description delayed scapular adduction  L, tightness of R upper trap with scapular adduction/ abduction,  propioception of cervical/ thoracolumbar stabilization with resistance band  , good carry over with deep core   Poor propioception of LKC in deep core HEP     Palpation   SI assessment  levelled shoudlers and pelvis    Palpation comment hypomobile C/T junction, medial scapular mm tightness L, deviated C5-T1 L, deep neck mm scapular inferior  angle  L          [facility] Adult PT Treatment/Exercise - 10/11/24 1305       Neuro Re-ed    Neuro Re-ed Details  provided propioception cues for cervicothoracular stabilization with resistance band, upgraded from red to green band , provided tactile cues for gaining pelvic floor  / LKC  awareness of coordinaiton of deep core training      Modalities   Modalities Moist Heat      Moist Heat Therapy   Number Minutes Moist Heat 10 Minutes    Moist Heat Location --   spine in hooklying with instruction of new HEP     Manual Therapy   Manual therapy comments distraction, STMMWM at problem areas ntoed in assessment, PA mob GRade III in prone at T10-L1, medial scapular region to promote scapular mobility L , minimize C/T junction hypomobile L            HOME EXERCISE PROGRAM: See pt instruction section    ASSESSMENT:  CLINICAL IMPRESSION:  Improvements: Pt has noticed no more leakage after sitting on toilet and standing up since starting PT LBP has resolved  Levelled pelvis and shoulders and no more forward head posture / thoracic kyphosis    Returned to resistance band exercise as pt reported no more issues with neck/ arm pain that she had gone to the Urgent Care a few weeks ago. Provided propioception cues for cervicothoracular stabilization with resistance band, upgraded from red to green band , provided tactile cues for gaining pelvic floor  / LKC  awareness of coordinaiton of  deep core training              Pt demo'd improved deep core coordination but required cues for  LKC and pelvis propioception.  Plan to progress to low ab strengthening with resistance band next week and d/c as pt needed to self-d/c with changes in her insurance                                                     Pt benefits from skilled PT.    OBJECTIVE IMPAIRMENTS decreased activity tolerance, decreased coordination, decreased endurance, decreased mobility, difficulty walking, decreased ROM, decreased strength, decreased safety awareness, hypomobility, increased muscle spasms, impaired flexibility, improper body mechanics, postural dysfunction, and pain   ACTIVITY LIMITATIONS  self-care,  sleep, home chores, work tasks    PARTICIPATION LIMITATIONS:  community   PERSONAL FACTORS   affecting patient's functional outcome:    REHAB POTENTIAL: Good   CLINICAL DECISION MAKING: Evolving/moderate complexity   EVALUATION COMPLEXITY: Moderate    PATIENT EDUCATION:    Education details: Showed pt anatomy images. Explained muscles attachments/ connection, physiology of deep core system/ spinal- thoracic-pelvis-lower kinetic chain as they relate to pt's presentation, Sx, and past Hx. Explained what and how these areas of deficits need to be restored to balance and function    See Therapeutic activity / neuromuscular re-education section  Answered pt's questions.   Person educated: Patient Education method: Explanation, Demonstration, Tactile cues, Verbal cues, and Handouts Education comprehension: verbalized understanding, returned demonstration, verbal cues required, tactile cues required, and needs further education     PLAN: PT FREQUENCY: 1x/week   PT DURATION: 10 weeks   PLANNED INTERVENTIONS:   Gait training;Stair training;Functional mobility training;DME Instruction;Therapeutic activities;Therapeutic exercise;Balance training;Neuromuscular re-education;Patient/family education;Vestibular;Visual/perceptual remediation/compensation;Passive range of motion;Moist  Heat;Cryotherapy;Traction;Canalith Repostioning;Joint Manipulations;Manual lymph drainage;Manual techniques;Scar mobilization;Energy conservation;Dry needling;ADLs/Self Care Home Management;Biofeedback;Electrical Stimulation;Taping    PLAN FOR NEXT SESSION: See clinical impression for plan     GOALS: Goals reviewed with patient? Yes  SHORT TERM GOALS: Target date: 08/23/2024    Pt will demo IND with HEP                    Baseline: Not IND            Goal status: INITIAL   LONG TERM GOALS: Target date: 10/04/2024    1.Pt will demo proper deep core coordination without chest breathing and optimal excursion of diaphragm/pelvic floor in order to promote spinal stability and pelvic floor function  Baseline: dyscoordination Goal status: INITIAL  2.  Pt will demo proper body mechanics in against gravity tasks and ADLs  work tasks, fitness  to minimize straining pelvic floor / back    Baseline: not IND, improper form that places strain on pelvic floor  Goal status: INITIAL    3. Pt will demo increased gait speed > 1.3 m/s with reciprocal gait pattern, longer stride length  in order to ambulate safely in community and return to fitness routine  Baseline: 1.18 m/s, limited posterior rotation of R rib,  R hip hike  Goal status: INITIAL    4. Pt will demo levelled pelvic girdle and shoulder height in order to progress to deep core strengthening HEP and restore mobility at spine, pelvis, gait, posture minimize falls, and improve balance  Baseline:  SI assessment  R shoulder/ R  pelvic lowered    Goal status: INITIAL   5. Pt will improve PFDI-7 questionnaire to  pts  score change  to demo improved QOL  Baseline:    ( greater pts indicate greater negative impact on QOL)   68   pts  ( total)  10   pts  ( UIQ-7 )   11 pts  ( CRAIQ-7 )    48  pts  ( POPIQ-7 )  Goal status: INITIAL  6. Pt will demo proper body mechanics with stand<> floor, sit <> floor, and with sitting on floor ,  and picking up son from  floor or hold him and report no pelvic pain during these transfers in order to home school and change diapers for autistic    37 yr old son who weights 60 lbs  .  Baseline:  Pt noticed pain happens when she gets up from the floor because she sits on the floor occur and during sit to stand  Goal status: INITIAL  7.  Pt will report post urine dribble occurring < 50% of the time  Baseline: dribbling of urine after urination when getting up to stand:  occurs more recently in the past year. It occurs 75% of the time with urination .  Goal status: INITIAL  8. Pt will demo improved alignment with t/f from floor to stand, sit to stand and rolling out of bed and report decreased LBP < 2/10 Baseline::Get up from sitting on the floor and chair and   hurts with rolling to get out of bed. Sleeps on her side with body pillow with her leg over her pillow. Pain 7-8/10  Goal status: INITIAL   Pia Lupe Plump, PT 10/11/2024, 11:57 AM

## 2024-10-11 NOTE — Patient Instructions (Signed)
Place band in "U"    band under ballmounds  while laying on back w/ knees bent   Lying on back, knees bent    band under ballmounds  while laying on back w/ knees bent  "W" exercise 30 reps  Band is placed under feet, knees bent, feet are hip width apart Hold band with thumbs point out, keep upper arm and elbow touching the bed the whole time  - inhale and then exhale pull bands by bending elbows hands move in a "w"  (feel shoulder blades squeezing)   ________________

## 2024-10-12 DIAGNOSIS — K219 Gastro-esophageal reflux disease without esophagitis: Secondary | ICD-10-CM | POA: Diagnosis not present

## 2024-10-12 DIAGNOSIS — R1032 Left lower quadrant pain: Secondary | ICD-10-CM | POA: Diagnosis not present

## 2024-10-12 DIAGNOSIS — R1031 Right lower quadrant pain: Secondary | ICD-10-CM | POA: Diagnosis not present

## 2024-10-12 DIAGNOSIS — R198 Other specified symptoms and signs involving the digestive system and abdomen: Secondary | ICD-10-CM | POA: Diagnosis not present

## 2024-10-18 ENCOUNTER — Ambulatory Visit: Admitting: Physical Therapy

## 2024-10-18 DIAGNOSIS — M533 Sacrococcygeal disorders, not elsewhere classified: Secondary | ICD-10-CM | POA: Insufficient documentation

## 2024-10-18 DIAGNOSIS — R293 Abnormal posture: Secondary | ICD-10-CM | POA: Insufficient documentation

## 2024-10-18 DIAGNOSIS — M5459 Other low back pain: Secondary | ICD-10-CM | POA: Insufficient documentation

## 2024-10-18 NOTE — Therapy (Signed)
 OUTPATIENT PHYSICAL THERAPY TREATMENT   / Discharge SUMMARY across 7 visits    Patient Name: Shannon Madden MRN: [id number] DOB:May 11, 1996, 28 y.o., female Today's Date: 10/18/2024   PT End of Session - 10/18/24 1154     Visit Number 7    Number of Visits 10    Date for Recertification  10/19/24    PT Start Time 1150    PT Stop Time 1230    PT Time Calculation (min) 40 min    Activity Tolerance Patient tolerated treatment well;No increased pain    Behavior During Therapy WFL for tasks assessed/performed          Past Medical History:  Diagnosis Date   Medical history non-contributory    No past surgical history on file. Patient Active Problem List   Diagnosis Date Noted   Sepsis (HCC) 05/20/2017   Labor and delivery, indication for care 05/06/2017   Indication for care in labor and delivery, antepartum 05/05/2017   Echogenic intracardiac focus of fetus on prenatal ultrasound 01/30/2017   Pelviectasis, renal 01/30/2017   MDD (major depressive disorder), single episode, severe (HCC) 12/24/2013    PCP:  Gaetana Haddock, PA-C   REFERRING PROVIDER: Edsel Blush CNM   REFERRING DIAG:  R10.32 (ICD-10-CM) - Left lower quadrant pain R10.2 (ICD-10-CM) - Pelvic and perineal pain N94.10 (ICD-10-CM) - Unspecified dyspareunia  Rationale for Evaluation and Treatment Rehabilitation  THERAPY DIAG:  Other low back pain  Abnormal posture  Sacrococcygeal disorders, not elsewhere classified  ONSET DATE:   SUBJECTIVE:  SUBJECTIVE STATEMENT Today:  Pt  reports she had no LBP and midback pain  for the past week and she was able to stand and cook for long periods .  Pt has added more relaxation practices and reading more books include of scrolling on phone Pt has noticed a difference with improved digestion and no more urgency to get to the bathroom when she eats slow and not on the go.    Pt has learned to slow down.                       SUBJECTIVE STATEMENT ON EVAL   07/26/24  1)  L pelvic pain:  located in the L groin area. It started after childbirth 7 years. Pt started getting health insurance 2 years ago and her doctor ordered imaging.  There was no findings for the cause of her pain. Pt has had ruptured cysts in the past and there are still some small ones on her L ovary.  Her Certified midwifed referred her to Pelvic PT to further help with pain . Pain occurs with sexual intercourse during deep penetration. It feels like pulsating feeling on her L side. Worst with all fours position .  Pain 10/10.  Pt avoids certain positions now.  Pain also comes and goes during random times during any position not related to sexual intercourse. Pt noticed pain happens when she gets up from the floor because she sits on the floor occur and during sit to stand  Pt also feels pelvic pain  and LBP when she gets up from the floor, sit to stand, and on the floor with autistic son.  She also has to  change diapers , pick up and carry sometimes her son who weights 60 lbs  .    2) dribbling of urine after urination when getting up to stand:  occurs more recently in the past year. It occurs 75% of the time  with urination .  Denied lowered pelvic organ / pressure sensation.    3)  B LBP - upper and lower, non radiating.  : Get up from sitting on the floor and chair and   hurts with rolling to get out of bed. Sleeps on her side with body pillow with her leg over her pillow. Pain 7-8/10     PERTINENT HISTORY:    PAIN:  Are you having pain? Yes: see above   PRECAUTIONS: No   WEIGHT BEARING RESTRICTIONS: No  FALLS:  Has patient fallen in last 6 months? 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   PLOF: Independent  PATIENT GOALS:   Alleviate L pelvic pain and LBP and to not have dribbling after urination upon standing     OBJECTIVE:   [facility] PT  Assessment - 10/18/24 1155       Palpation   SI assessment  levelled shoudlers and pelvis    Palpation comment 1.5 m/s, reciprocal , arm swings          [facility] Adult PT Treatment/Exercise - 10/18/24 1325       Self-Care   Other Self-Care Comments  reassessed goals and discussed contiuation of HEP,  Consolidated HEP            HOME EXERCISE PROGRAM: See pt instruction section    ASSESSMENT:  CLINICAL IMPRESSION:  Pt met 100% of all her goals related to L pelvic pain, LBP, and post urine dribbling.   Improvements include: Pt has noticed no more leakage after sitting on toilet and standing up since starting PT. Post urine dribble occurring < 10% of the time instead of 75% of the time  L pelvic pain has resolved since starting PT  LBP has resolved.she had no LBP and midback pain  for the past week and she was able to stand and cook for long periods and picking up her son.  Levelled pelvis and shoulders and no more forward head posture / thoracic kyphosis  Improved gait speed from 1.18 m/s to 1.5 m/s with no more gait deviations Improved PFDI-7 questionnaire score which  demo improved QOL  Pt has incorporated more relaxation practices to help relax body  Pt is working on not eating on the go and improved relaxation state for digestion. Pt has noticed a difference with improved digestion and no more urgency to get to the bathroom when she eats slow and not on the go.   Consolidated HEP and pt agreed to maintain HEP and positive behavorial changes to further improve her Sx Pt is ready for d/c at this time.   OBJECTIVE IMPAIRMENTS decreased activity tolerance, decreased coordination, decreased endurance, decreased mobility, difficulty walking, decreased ROM, decreased strength, decreased safety awareness, hypomobility, increased muscle spasms, impaired flexibility, improper body mechanics, postural dysfunction, and pain   ACTIVITY LIMITATIONS  self-care,  sleep, home chores, work  tasks    PARTICIPATION LIMITATIONS:  community   PERSONAL FACTORS   affecting patient's functional outcome:    REHAB POTENTIAL: Good   CLINICAL DECISION MAKING: Evolving/moderate complexity   EVALUATION COMPLEXITY: Moderate    PATIENT EDUCATION:    Education details: Showed pt anatomy images. Explained muscles attachments/ connection, physiology of deep core system/ spinal- thoracic-pelvis-lower kinetic chain as they relate to pt's presentation, Sx, and past Hx. Explained what and how these areas of  deficits need to be restored to balance and function    See Therapeutic activity / neuromuscular re-education section  Answered pt's questions.   Person educated: Patient Education method: Explanation, Demonstration, Tactile cues, Verbal cues, and Handouts Education comprehension: verbalized understanding, returned demonstration, verbal cues required, tactile cues required, and needs further education     PLAN: PT FREQUENCY: 1x/week   PT DURATION: 10 weeks   PLANNED INTERVENTIONS:   Gait training;Stair training;Functional mobility training;DME Instruction;Therapeutic activities;Therapeutic exercise;Balance training;Neuromuscular re-education;Patient/family education;Vestibular;Visual/perceptual remediation/compensation;Passive range of motion;Moist Heat;Cryotherapy;Traction;Canalith Repostioning;Joint Manipulations;Manual lymph drainage;Manual techniques;Scar mobilization;Energy conservation;Dry needling;ADLs/Self Care Home Management;Biofeedback;Electrical Stimulation;Taping    PLAN FOR NEXT SESSION: See clinical impression for plan     GOALS: Goals reviewed with patient? Yes  SHORT TERM GOALS: Target date: 08/23/2024    Pt will demo IND with HEP                    Baseline: Not IND            Goal status: MET    LONG TERM GOALS: Target date: 10/04/2024    1.Pt will demo proper deep core coordination without chest breathing and optimal excursion of diaphragm/pelvic  floor in order to promote spinal stability and pelvic floor function  Baseline: dyscoordination Goal status:MET   2.  Pt will demo proper body mechanics in against gravity tasks and ADLs  work tasks, fitness  to minimize straining pelvic floor / back    Baseline: not IND, improper form that places strain on pelvic floor  Goal status:  MET      3. Pt will demo increased gait speed > 1.3 m/s with reciprocal gait pattern, longer stride length  in order to ambulate safely in community and return to fitness routine  Baseline: 1.18 m/s, limited posterior rotation of R rib,  R hip hike  Goal status: MET 10/18/24:  1.5 m/s     4. Pt will demo levelled pelvic girdle and shoulder height in order to progress to deep core strengthening HEP and restore mobility at spine, pelvis, gait, posture minimize falls, and improve balance  Baseline:  SI assessment  R shoulder/ R pelvic lowered    Goal status: MET    5. Pt will improve PFDI-7 questionnaire to  pts  score change  to demo improved QOL  Baseline:    ( greater pts indicate greater negative impact on QOL)   68   pts  ( total)   --> 38    10   pts  ( UIQ-7 )  --> 0    11 pts  ( CRAIQ-7 )  --> 10     48  pts  ( POPIQ-7 )   --> 29   Goal status: MET   6. Pt will demo proper body mechanics with stand<> floor, sit <> floor, and with sitting on floor , and picking up son from  floor or hold him and report no pelvic pain during these transfers in order to home school and change diapers for autistic    52 yr old son who weights 60 lbs  .  Baseline:  Pt noticed pain happens when she gets up from the floor because she sits on the floor occur and during sit to stand  Goal status:  MET    7.  Pt will report post urine dribble occurring < 50% of the time  Baseline: dribbling of urine after urination when getting up to stand:  occurs more recently in the past year.  It occurs 75% of the time with urination .  Goal status:  MET ( 10/18/24: less than 10% of  the time)   8. Pt will demo improved alignment with t/f from floor to stand, sit to stand and rolling out of bed and report decreased LBP < 2/10 Baseline::Get up from sitting on the floor and chair and   hurts with rolling to get out of bed. Sleeps on her side with body pillow with her leg over her pillow. Pain 7-8/10  Goal status:  MET ( 10/18/24: no pain with body mechanics with t/f from floor to stand, sit to stand and rolling out of bed)     Pia Lupe Plump, PT 10/18/2024, 11:55 AM

## 2024-10-18 NOTE — Patient Instructions (Signed)
    Motion is lotion  - daily  good morning and evening     On your side - winging and brushing   Pillow between knees and behind back     On your back with towel roll under the neck  - neck 6 directions - angel wings - ( dragging arms)  - ZigZag stretch scoot hips to one side and rock knees to opposite, arms by side ,palms up       On your other side - winging and brushing   Pillow between knees and behind back                Strengthening for deep core ( daily)  Deep core level 1-2      Strengthening for the back and midback with A -W band exercises every other day

## 2024-10-22 ENCOUNTER — Ambulatory Visit: Admitting: Registered Nurse

## 2024-10-22 ENCOUNTER — Encounter
Admission: AD | Disposition: A | Discharge status: Home / Self Care | Payer: Self-pay | Source: Ambulatory Visit | Attending: Gastroenterology

## 2024-10-22 ENCOUNTER — Ambulatory Visit
Admission: AD | Admit: 2024-10-22 | Discharge: 2024-10-22 | Disposition: A | Discharge status: Home / Self Care | Source: Ambulatory Visit | Attending: Gastroenterology | Admitting: Gastroenterology

## 2024-10-22 ENCOUNTER — Encounter: Admission: RE | Disposition: A | Payer: Self-pay | Source: Home / Self Care | Attending: Gastroenterology

## 2024-10-22 ENCOUNTER — Ambulatory Visit: Admitting: Certified Registered"

## 2024-10-22 ENCOUNTER — Ambulatory Visit
Admission: RE | Admit: 2024-10-22 | Discharge: 2024-10-22 | Disposition: A | Discharge status: Home / Self Care | Attending: Gastroenterology | Admitting: Gastroenterology

## 2024-10-22 DIAGNOSIS — R1030 Lower abdominal pain, unspecified: Secondary | ICD-10-CM | POA: Insufficient documentation

## 2024-10-22 DIAGNOSIS — R194 Change in bowel habit: Secondary | ICD-10-CM | POA: Diagnosis not present

## 2024-10-22 DIAGNOSIS — Z6841 Body Mass Index (BMI) 40.0 and over, adult: Secondary | ICD-10-CM | POA: Diagnosis not present

## 2024-10-22 DIAGNOSIS — K922 Gastrointestinal hemorrhage, unspecified: Secondary | ICD-10-CM | POA: Diagnosis not present

## 2024-10-22 DIAGNOSIS — K640 First degree hemorrhoids: Secondary | ICD-10-CM | POA: Insufficient documentation

## 2024-10-22 DIAGNOSIS — K219 Gastro-esophageal reflux disease without esophagitis: Secondary | ICD-10-CM | POA: Insufficient documentation

## 2024-10-22 DIAGNOSIS — I10 Essential (primary) hypertension: Secondary | ICD-10-CM | POA: Diagnosis not present

## 2024-10-22 DIAGNOSIS — K6389 Other specified diseases of intestine: Secondary | ICD-10-CM | POA: Insufficient documentation

## 2024-10-22 DIAGNOSIS — E66813 Obesity, class 3: Secondary | ICD-10-CM | POA: Diagnosis not present

## 2024-10-22 DIAGNOSIS — R195 Other fecal abnormalities: Secondary | ICD-10-CM | POA: Insufficient documentation

## 2024-10-22 DIAGNOSIS — K649 Unspecified hemorrhoids: Secondary | ICD-10-CM | POA: Diagnosis not present

## 2024-10-22 HISTORY — DX: Headache, unspecified: R51.9

## 2024-10-22 HISTORY — DX: Essential (primary) hypertension: I10

## 2024-10-22 HISTORY — PX: COLONOSCOPY: SHX5424

## 2024-10-22 LAB — POCT PREGNANCY, URINE: Preg Test, Ur: NEGATIVE

## 2024-10-22 SURGERY — COLONOSCOPY
Anesthesia: General

## 2024-10-22 MED ORDER — PROPOFOL 10 MG/ML IV BOLUS
INTRAVENOUS | Status: AC
  Filled 2024-10-22: qty 20

## 2024-10-22 MED ORDER — SODIUM CHLORIDE 0.9 % IV SOLN: INTRAVENOUS | Status: DC

## 2024-10-22 MED ORDER — PROPOFOL 10 MG/ML IV BOLUS
INTRAVENOUS | Status: AC
  Filled 2024-10-22: qty 40

## 2024-10-22 MED ORDER — LIDOCAINE HCL (CARDIAC) PF 100 MG/5ML IV SOSY
PREFILLED_SYRINGE | INTRAVENOUS | Status: DC | PRN
  Administered 2024-10-22: 100 mg via INTRAVENOUS

## 2024-10-22 MED ORDER — LIDOCAINE HCL (PF) 2 % IJ SOLN
INTRAMUSCULAR | Status: AC
  Filled 2024-10-22: qty 5

## 2024-10-22 MED ORDER — PROPOFOL 500 MG/50ML IV EMUL
INTRAVENOUS | Status: DC | PRN
  Administered 2024-10-22: 120 ug/kg/min via INTRAVENOUS
  Administered 2024-10-22: 100 mg via INTRAVENOUS

## 2024-10-22 MED ORDER — SODIUM CHLORIDE 0.9 % IV SOLN: INTRAVENOUS | Status: DC | PRN

## 2024-10-22 MED ORDER — PROPOFOL 500 MG/50ML IV EMUL
INTRAVENOUS | Status: DC | PRN
  Administered 2024-10-22: 120 mg via INTRAVENOUS
  Administered 2024-10-22: 100 ug/kg/min via INTRAVENOUS

## 2024-10-22 NOTE — Interval H&P Note (Signed)
 History and Physical Interval Note:  10/22/2024 10:02 AM  Shannon Madden  has presented today for surgery, with the diagnosis of Abdominal pain,irregular bowel habits , abnormaal stool test.  The various methods of treatment have been discussed with the patient and family. After consideration of risks, benefits and other options for treatment, the patient has consented to  Procedure(s): COLONOSCOPY (N/A) as a surgical intervention.  The patient's history has been reviewed, patient examined, no change in status, stable for surgery.  I have reviewed the patient's chart and labs.  Questions were answered to the patient's satisfaction.     Ole ONEIDA Schick  Ok to proceed with colonoscopy

## 2024-10-22 NOTE — Anesthesia Preprocedure Evaluation (Signed)
 Anesthesia Evaluation  Patient identified by MRN, date of birth, ID band Patient awake    Reviewed: Allergy & Precautions, H&P , NPO status , Patient's Chart, lab work & pertinent test results, reviewed documented beta blocker date and time   History of Anesthesia Complications Negative for: history of anesthetic complications  Airway Mallampati: III  TM Distance: >3 FB Neck ROM: full    Dental  (+) Dental Advidsory Given, Teeth Intact   Pulmonary neg pulmonary ROS   Pulmonary exam normal breath sounds clear to auscultation       Cardiovascular Exercise Tolerance: Good hypertension, (-) angina (-) Past MI and (-) Cardiac Stents Normal cardiovascular exam(-) dysrhythmias (-) Valvular Problems/Murmurs Rhythm:regular Rate:Normal     Neuro/Psych  Headaches, neg Seizures PSYCHIATRIC DISORDERS  Depression       GI/Hepatic Neg liver ROS,GERD  ,,  Endo/Other  neg diabetes  Class 3 obesity  Renal/GU negative Renal ROS  negative genitourinary   Musculoskeletal   Abdominal   Peds  Hematology negative hematology ROS (+)   Anesthesia Other Findings Past Medical History: No date: Headache No date: Hypertension No date: Medical history non-contributory   Reproductive/Obstetrics negative OB ROS                              Anesthesia Physical Anesthesia Plan  ASA: 3  Anesthesia Plan: General   Post-op Pain Management:    Induction: Intravenous  PONV Risk Score and Plan: 3 and Propofol  infusion, TIVA and Treatment may vary due to age or medical condition  Airway Management Planned: Natural Airway and Nasal Cannula  Additional Equipment:   Intra-op Plan:   Post-operative Plan:   Informed Consent: I have reviewed the patients History and Physical, chart, labs and discussed the procedure including the risks, benefits and alternatives for the proposed anesthesia with the patient or authorized  representative who has indicated his/her understanding and acceptance.     Dental Advisory Given  Plan Discussed with: Anesthesiologist, CRNA and Surgeon  Anesthesia Plan Comments:         Anesthesia Quick Evaluation

## 2024-10-22 NOTE — Transfer of Care (Signed)
 Immediate Anesthesia Transfer of Care Note  Patient: Shannon Madden  Procedure(s) Performed: COLONOSCOPY  Patient Location: Endoscopy Unit  Anesthesia Type:General  Level of Consciousness: awake, alert , and drowsy  Airway & Oxygen Therapy: Patient Spontanous Breathing  Post-op Assessment: Report given to RN and Post -op Vital signs reviewed and stable  Post vital signs: Reviewed and stable  Last Vitals:  Vitals Value Taken Time  BP 123/71 1305  Temp 35.9 1305  Pulse 78 1305  Resp 16 1305  SpO2 99 1305    Last Pain:  Vitals:   10/22/24 0942  TempSrc: Temporal  PainSc: 0-No pain         Complications: No notable events documented.

## 2024-10-22 NOTE — Op Note (Signed)
 [facility] Patient Name: Shannon Madden Procedure Date: 10/22/2024 12:13 PM MRN: [id number] Account #: [id number] Date of Birth: 1996/08/31 Admit Type: Outpatient Age: 28 Room: [facility] ENDO ROOM 3 Gender: Female Note Status: Finalized Instrument Name: Colon Scope [id number] Procedure:             Colonoscopy Indications:           Lower abdominal pain, Change in bowel habits, Abnormal                         stool test Providers:             Ole Schick MD, MD Referring MD:          Gaetana CROME. Fields (Referring MD) Medicines:             Monitored Anesthesia Care Complications:         No immediate complications. Estimated blood loss:                         Minimal. Procedure:             Pre-Anesthesia Assessment:                        - Prior to the procedure, a History and Physical was                         performed, and patient medications and allergies were                         reviewed. The patient is competent. The risks and                         benefits of the procedure and the sedation options and                         risks were discussed with the patient. All questions                         were answered and informed consent was obtained.                         Patient identification and proposed procedure were                         verified by the physician, the nurse, the                         anesthesiologist, the anesthetist and the technician                         in the endoscopy suite. Mental Status Examination:                         alert and oriented. Airway Examination: normal                         oropharyngeal airway and neck mobility. Respiratory  Examination: clear to auscultation. CV Examination:                         normal. Prophylactic Antibiotics: The patient does not                         require prophylactic antibiotics. Prior                          Anticoagulants: The patient has taken no anticoagulant                         or antiplatelet agents. ASA Grade Assessment: III - A                         patient with severe systemic disease. After reviewing                         the risks and benefits, the patient was deemed in                         satisfactory condition to undergo the procedure. The                         anesthesia plan was to use monitored anesthesia care                         (MAC). Immediately prior to administration of                         medications, the patient was re-assessed for adequacy                         to receive sedatives. The heart rate, respiratory                         rate, oxygen saturations, blood pressure, adequacy of                         pulmonary ventilation, and response to care were                         monitored throughout the procedure. The physical                         status of the patient was re-assessed after the                         procedure.                        After obtaining informed consent, the colonoscope was                         passed under direct vision. Throughout the procedure,                         the patient's blood pressure, pulse, and oxygen  saturations were monitored continuously. The was                         introduced through the anus and advanced to the the                         terminal ileum, with identification of the appendiceal                         orifice and IC valve. The colonoscopy was performed                         without difficulty. The patient tolerated the                         procedure well. The quality of the bowel preparation                         was good. The terminal ileum, ileocecal valve,                         appendiceal orifice, and rectum were photographed. Findings:      The perianal and digital rectal examinations were normal.      The terminal ileum appeared  normal. Biopsies were taken with a cold       forceps for histology. Estimated blood loss was minimal.      Biopsies for histology were taken with a cold forceps from the entire       colon for evaluation of microscopic colitis. Estimated blood loss was       minimal.      A localized area of mildly erythematous mucosa was found in the sigmoid       colon. Likely scope trauma. This was biopsied with a cold forceps for       histology. Estimated blood loss was minimal.      Internal hemorrhoids were found during retroflexion. The hemorrhoids       were Grade I (internal hemorrhoids that do not prolapse).      The exam was otherwise without abnormality on direct and retroflexion       views. Impression:            - The examined portion of the ileum was normal.                         Biopsied.                        - Erythematous mucosa in the sigmoid colon. Biopsied.                        - Internal hemorrhoids.                        - The examination was otherwise normal on direct and                         retroflexion views.                        - Biopsies were taken with a cold forceps from  the                         entire colon for evaluation of microscopic colitis. Recommendation:        - Discharge patient to home.                        - Resume previous diet.                        - Continue present medications.                        - Await pathology results.                        - Repeat colonoscopy at age 66 for screening purposes.                        - Return to referring physician as previously                         scheduled. Procedure Code(s):     --- Professional ---                        [phone], Colonoscopy, flexible; with biopsy, single or                         multiple Diagnosis Code(s):     --- Professional ---                        K64.0, First degree hemorrhoids                        K63.89, Other specified diseases of intestine                         R10.30, Lower abdominal pain, unspecified                        R19.4, Change in bowel habit CPT copyright 2022 [employer]. All rights reserved. The codes documented in this report are preliminary and upon coder review may  be revised to meet current compliance requirements. Ole Schick MD, MD 10/22/2024 1:06:33 PM Number of Addenda: 0 Note Initiated On: 10/22/2024 12:13 PM Scope Withdrawal Time: 0 hours 9 minutes 7 seconds  Total Procedure Duration: 0 hours 13 minutes 16 seconds  Estimated Blood Loss:  Estimated blood loss was minimal.      [facility]

## 2024-10-22 NOTE — Anesthesia Postprocedure Evaluation (Signed)
 Anesthesia Post Note  Patient: Shannon Madden  Procedure(s) Performed: COLONOSCOPY  Patient location during evaluation: Endoscopy Anesthesia Type: General Level of consciousness: awake and alert Pain management: pain level controlled Vital Signs Assessment: post-procedure vital signs reviewed and stable Respiratory status: spontaneous breathing, nonlabored ventilation, respiratory function stable and patient connected to nasal cannula oxygen Cardiovascular status: blood pressure returned to baseline and stable Postop Assessment: no apparent nausea or vomiting Anesthetic complications: no   No notable events documented.   Last Vitals:  Vitals:   10/22/24 1315 10/22/24 1326  BP: 109/75 114/76  Pulse: 87 80  Resp: 20 18  Temp:    SpO2: 100% 100%    Last Pain:  Vitals:   10/22/24 1326  TempSrc:   PainSc: 0-No pain                 Prentice Murphy

## 2024-10-22 NOTE — H&P (Signed)
 Outpatient short stay form Pre-procedure 10/22/2024  Shannon ONEIDA Schick, MD  Primary Physician: Harvey Gaetana CROME, NP  Reason for visit:  Abnormal lab  History of present illness:    28 y/o lady with history of hypertension and obesity here for colonoscopy due to diarrhea and elevated fecal calprotectin. No blood thinners. No family history of GI malignancies.    Current Facility-Administered Medications:    0.9 %  sodium chloride  infusion, , Intravenous, Continuous, Purva Vessell, Shannon ONEIDA, MD, Last Rate: 20 mL/hr at 10/22/24 0952, New Bag at 10/22/24 9047  Medications Prior to Admission  Medication Sig Dispense Refill Last Dose/Taking   losartan (COZAAR) 25 MG tablet Take 25 mg by mouth daily.   10/22/2024 Morning   nortriptyline (PAMELOR) 50 MG capsule Take 50 mg by mouth at bedtime.   10/21/2024   propranolol (INDERAL) 20 MG tablet Take 20 mg by mouth 3 (three) times daily.   10/21/2024   Ubrogepant (UBRELVY) 50 MG TABS Take 50 mg by mouth.   10/21/2024   benzocaine -Menthol  (DERMOPLAST) 20-0.5 % AERO Apply 1 application topically as needed for irritation (perineal discomfort). (Patient not taking: Reported on 05/20/2017) 1 each 1    medroxyPROGESTERone (DEPO-PROVERA) 150 MG/ML injection Inject 150 mg into the muscle every 3 (three) months.      mupirocin  ointment (BACTROBAN ) 2 % Place into the nose 2 (two) times daily. 22 g 0    norethindrone  (ORTHO MICRONOR ) 0.35 MG tablet Take 1 tablet (0.35 mg total) by mouth daily. (Patient not taking: Reported on 05/20/2017) 1 Package 11    Prenatal Vit-Fe Fumarate-FA (MULTIVITAMIN-PRENATAL) 27-0.8 MG TABS tablet Take 1 tablet by mouth daily at 12 noon. (Patient not taking: Reported on 10/22/2024)   Not Taking   SUMAtriptan (IMITREX) 25 MG tablet Take 25 mg by mouth every 2 (two) hours as needed for migraine (1 to 4 tables po a day, prn for Migraine). May repeat in 2 hours if headache persists or recurs.        Allergies  Allergen Reactions   Coconut  (Cocos Nucifera) Anaphylaxis   Fish Allergy Anaphylaxis     Past Medical History:  Diagnosis Date   Headache    Hypertension    Medical history non-contributory     Review of systems:  Otherwise negative.    Physical Exam  Gen: Alert, oriented. Appears stated age.  HEENT: PERRLA. Lungs: No respiratory distress CV: RRR Abd: soft, benign, no masses Ext: No edema    Planned procedures: Proceed with colonoscopy. The patient understands the nature of the planned procedure, indications, risks, alternatives and potential complications including but not limited to bleeding, infection, perforation, damage to internal organs and possible oversedation/side effects from anesthesia. The patient agrees and gives consent to proceed.  Please refer to procedure notes for findings, recommendations and patient disposition/instructions.     Shannon ONEIDA Schick, MD [facility]

## 2024-10-22 NOTE — Transfer of Care (Signed)
 Immediate Anesthesia Transfer of Care Note  Patient: Shannon Madden  Procedure(s) Performed: COLONOSCOPY  Patient Location: Endoscopy Unit  Anesthesia Type:General  Level of Consciousness: awake, alert , and oriented  Airway & Oxygen Therapy: Patient Spontanous Breathing  Post-op Assessment: Report given to RN and Post -op Vital signs reviewed and stable  Post vital signs: Reviewed and stable  Last Vitals:  Vitals Value Taken Time  BP 135/79 1042  Temp 35.8 1042  Pulse 84 1042  Resp 20 1042  SpO2 98 1042    Last Pain:  Vitals:   10/22/24 0942  TempSrc: Temporal  PainSc: 0-No pain    Pt. Awaiting IV team to place an IV to resume colonoscopy procedure.      Complications: No notable events documented.

## 2024-10-22 NOTE — Anesthesia Postprocedure Evaluation (Signed)
 Anesthesia Post Note  Patient: Shannon Madden  Procedure(s) Performed: COLONOSCOPY COLONOSCOPY  Patient location during evaluation: Endoscopy Anesthesia Type: General Level of consciousness: awake and alert Pain management: pain level controlled Vital Signs Assessment: post-procedure vital signs reviewed and stable Respiratory status: spontaneous breathing, nonlabored ventilation, respiratory function stable and patient connected to nasal cannula oxygen Cardiovascular status: blood pressure returned to baseline and stable Postop Assessment: no apparent nausea or vomiting Anesthetic complications: no Comments: Patient with blown IV.  Getting IV team to come put in a new IV and will restart later   No notable events documented.   Last Vitals:  Vitals:   10/22/24 0942  BP: 124/87  Pulse: 90  Resp: 16  Temp: (!) 36.1 C  SpO2: 100%    Last Pain:  Vitals:   10/22/24 0942  TempSrc: Temporal  PainSc: 0-No pain                 Prentice Murphy

## 2024-10-25 ENCOUNTER — Encounter: Payer: Self-pay | Admitting: Gastroenterology

## 2024-10-26 LAB — SURGICAL PATHOLOGY

## 2024-11-01 ENCOUNTER — Encounter: Admitting: Physical Therapy

## 2024-11-29 ENCOUNTER — Encounter: Admitting: Physical Therapy

## 2024-12-06 ENCOUNTER — Encounter: Admitting: Physical Therapy

## 2024-12-13 ENCOUNTER — Encounter: Admitting: Physical Therapy
